# Patient Record
Sex: Female | Born: 1985 | Race: Black or African American | Hispanic: No | Marital: Single | Smoking: Never smoker
Health system: Southern US, Community
[De-identification: ages and names within clinical notes are randomized; demographics above are authoritative.]

## PROBLEM LIST (undated history)

## (undated) DIAGNOSIS — G35 Multiple sclerosis: Secondary | ICD-10-CM

## (undated) DIAGNOSIS — J45909 Unspecified asthma, uncomplicated: Secondary | ICD-10-CM

## (undated) DIAGNOSIS — E282 Polycystic ovarian syndrome: Principal | ICD-10-CM

## (undated) DIAGNOSIS — H539 Unspecified visual disturbance: Secondary | ICD-10-CM

## (undated) DIAGNOSIS — E669 Obesity, unspecified: Secondary | ICD-10-CM

## (undated) DIAGNOSIS — N83209 Unspecified ovarian cyst, unspecified side: Secondary | ICD-10-CM

## (undated) DIAGNOSIS — N912 Amenorrhea, unspecified: Principal | ICD-10-CM

## (undated) HISTORY — DX: Unspecified ovarian cyst, unspecified side: N83.209

## (undated) HISTORY — DX: Amenorrhea, unspecified: N91.2

## (undated) HISTORY — DX: Multiple sclerosis: G35

## (undated) HISTORY — DX: Polycystic ovarian syndrome: E28.2

## (undated) HISTORY — DX: Obesity, unspecified: E66.9

## (undated) HISTORY — DX: Unspecified visual disturbance: H53.9

## (undated) HISTORY — PX: CYST EXCISION: SHX5701

## (undated) HISTORY — PX: OVARIAN CYST REMOVAL: SHX89

---
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
## 2006-07-26 ENCOUNTER — Emergency Department (HOSPITAL_COMMUNITY): Admission: EM | Admit: 2006-07-26 | Discharge: 2006-07-26 | Payer: Self-pay | Admitting: Emergency Medicine

## 2006-08-02 ENCOUNTER — Emergency Department (HOSPITAL_COMMUNITY): Admission: EM | Admit: 2006-08-02 | Discharge: 2006-08-02 | Payer: Self-pay | Admitting: Emergency Medicine

## 2006-10-30 ENCOUNTER — Emergency Department (HOSPITAL_COMMUNITY): Admission: EM | Admit: 2006-10-30 | Discharge: 2006-10-30 | Payer: Self-pay | Admitting: Emergency Medicine

## 2007-03-11 IMAGING — CT CT HEAD W/O CM
1 series · 16 of 28 positions shown, 20 images · IV contrast (agent unspecified)
Comparison: Previous study 08/23/02

CLINICAL DATA: Headache.
HEAD CT WITHOUT CONTRAST:
TECHNIQUE: Contiguous axial images were obtained from the base of the skull through the vertex according to standard protocol without contrast.

[Series 6834: — · axial · 0.46mm/px · z∈[-591,-466]mm · 16 of 28 slices shown, 20 images]
[im 2/28  brain]
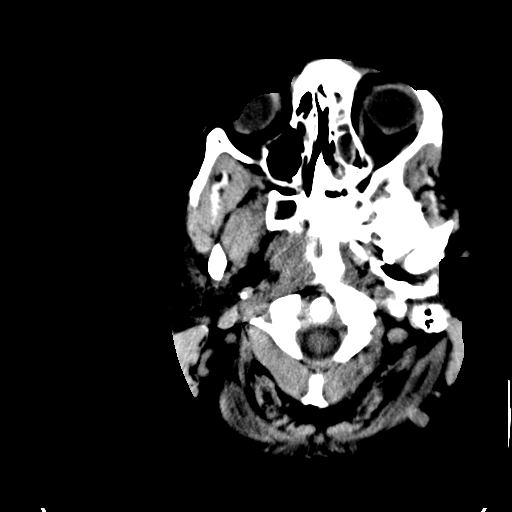
[im 2/28  bone]
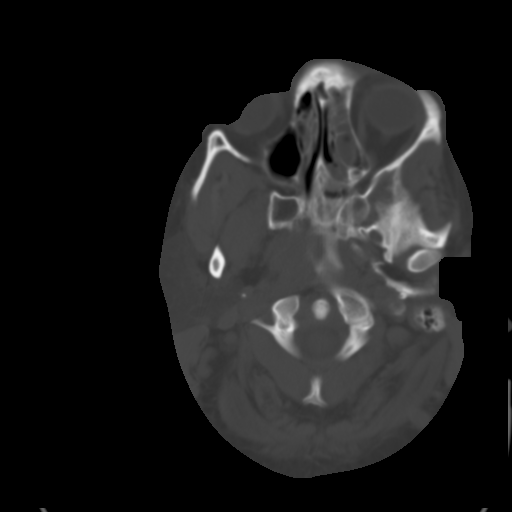
[im 4/28  brain]
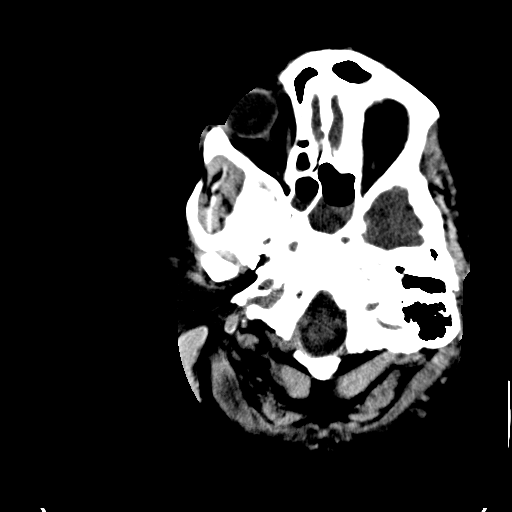
[im 6/28  brain]
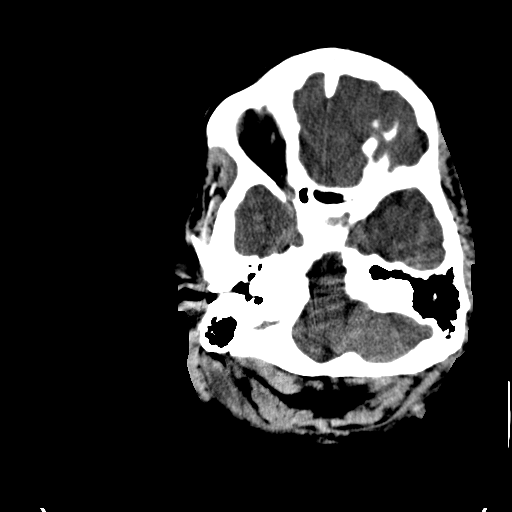
[im 7/28  brain]
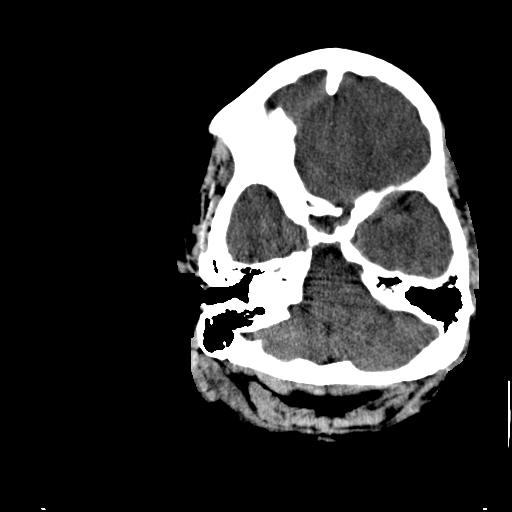
[im 9/28  brain]
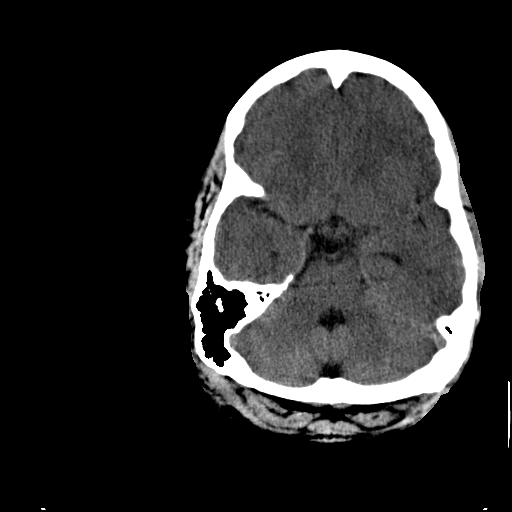
[im 9/28  bone]
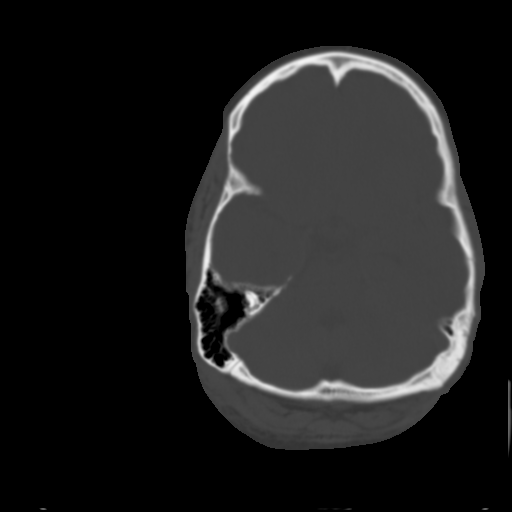
[im 10/28  brain]
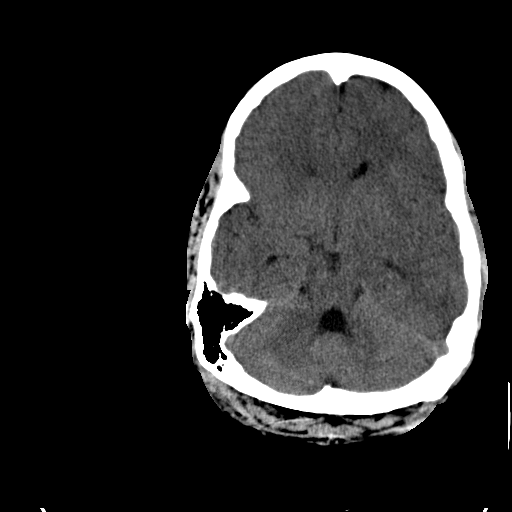
[im 12/28  brain]
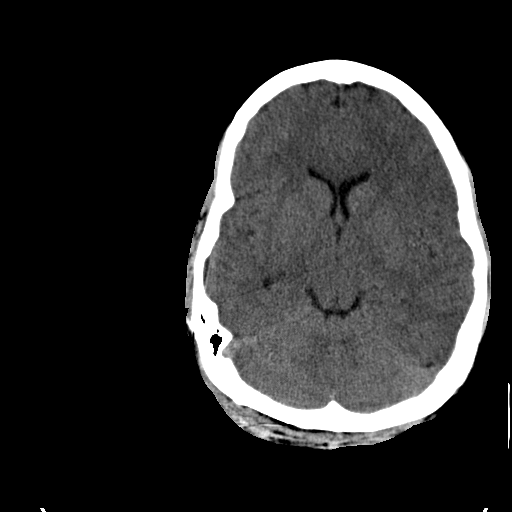
[im 14/28  brain]
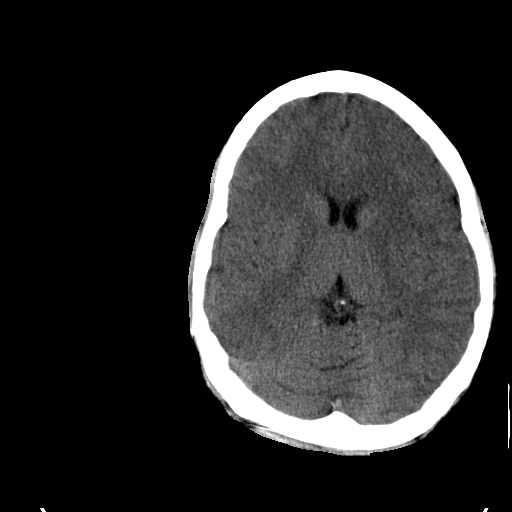
[im 15/28  brain]
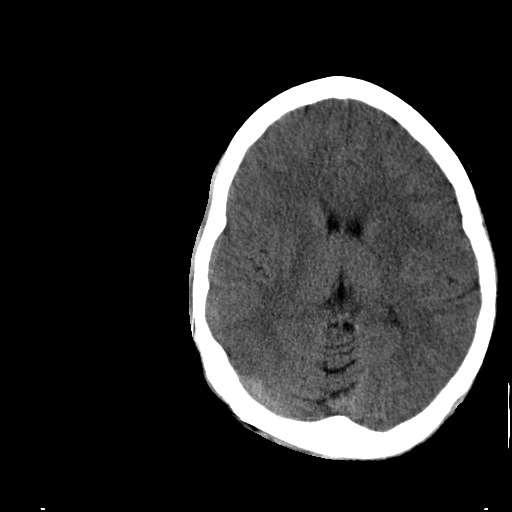
[im 15/28  bone]
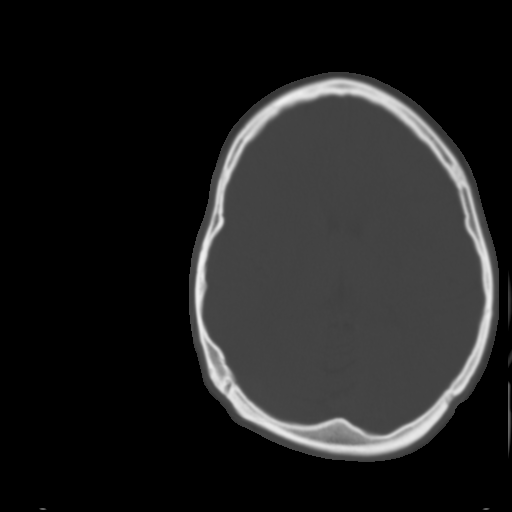
[im 17/28  brain]
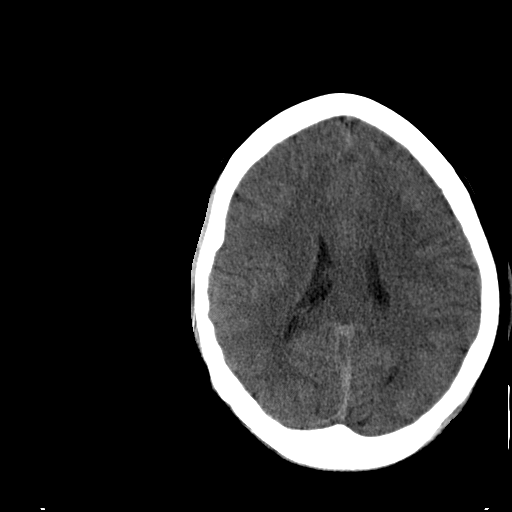
[im 19/28  brain]
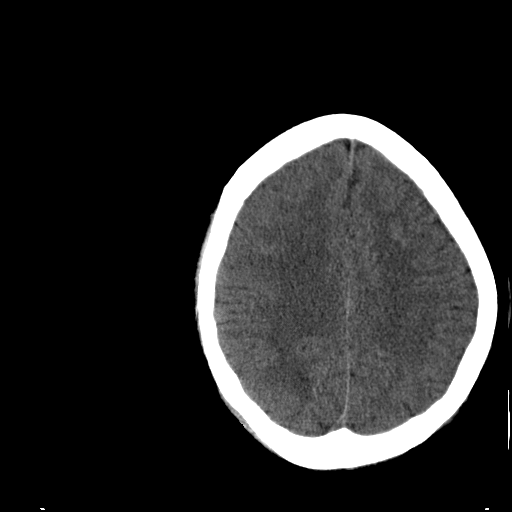
[im 20/28  brain]
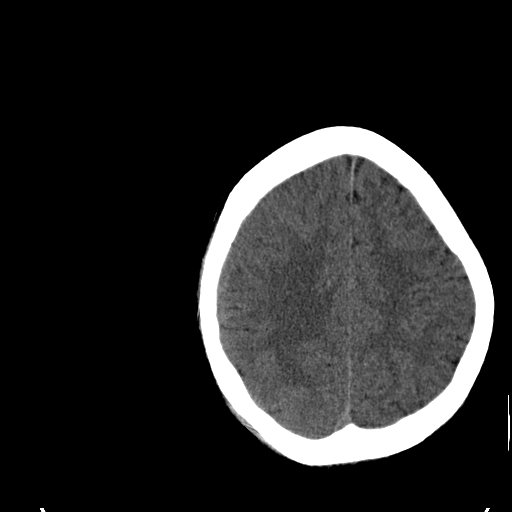
[im 22/28  brain]
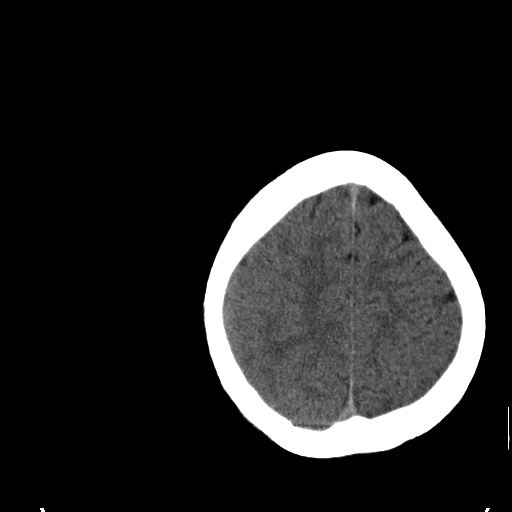
[im 22/28  bone]
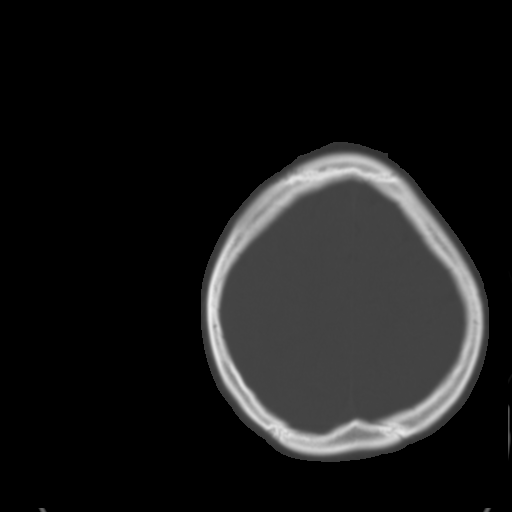
[im 23/28  brain]
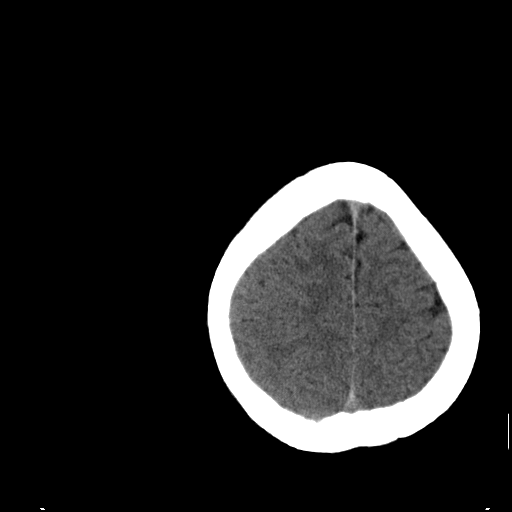
[im 25/28  brain]
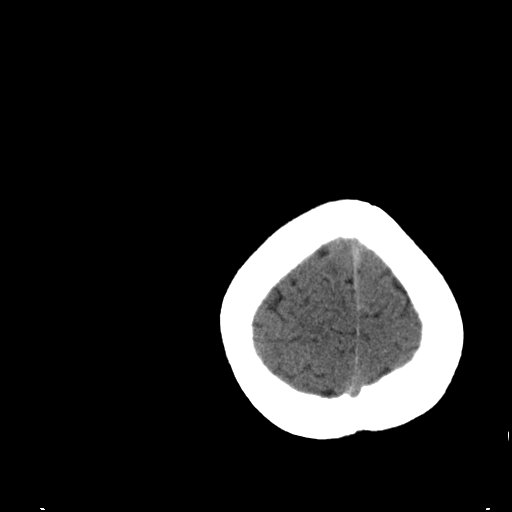
[im 27/28  brain]
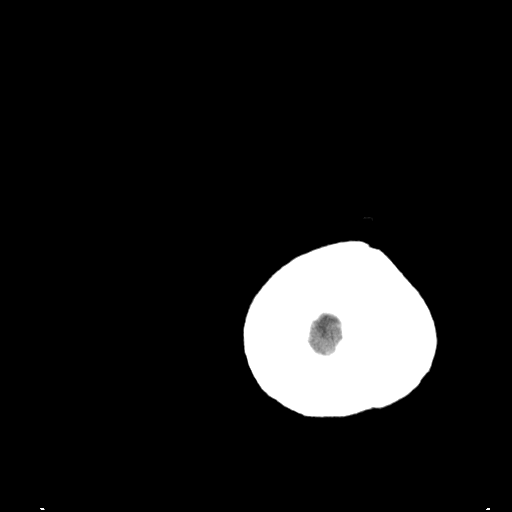

[16 of 28 positions shown; findings below may reference images not displayed]

FINDINGS: Air fluid levels are noted in the sphenoid sinus consistent with acute sinusitis.  No focal or acute intracranial abnormalities are seen.  The ventricles are normal in size and midline.  There are no areas of hemorrhage or brain edema.  No extra-axial fluid collections are present.  Bone windows show the calvarium and skull base to be intact.  Note is made of moderate nasopharyngeal soft tissue probably related to lymphoid hyperplasia.  
Note is made of moderate bilateral ethmoid fluid accumulation as well.
IMPRESSION: 1.  Acute sphenoid sinusitis.
2.  Moderate ethmoid fluid.
3.  No acute intracranial abnormality.
4.  Moderate lymphoid hyperplasia in the nasopharynx.

## 2013-04-20 DIAGNOSIS — I10 Essential (primary) hypertension: Secondary | ICD-10-CM | POA: Insufficient documentation

## 2013-04-27 ENCOUNTER — Encounter (HOSPITAL_COMMUNITY): Payer: Self-pay | Admitting: Emergency Medicine

## 2013-04-27 ENCOUNTER — Emergency Department (HOSPITAL_COMMUNITY)
Admission: EM | Admit: 2013-04-27 | Discharge: 2013-04-27 | Disposition: A | Discharge status: Home / Self Care | Payer: Medicaid Other | Attending: Emergency Medicine | Admitting: Emergency Medicine

## 2013-04-27 DIAGNOSIS — J45901 Unspecified asthma with (acute) exacerbation: Secondary | ICD-10-CM | POA: Insufficient documentation

## 2013-04-27 DIAGNOSIS — R0602 Shortness of breath: Secondary | ICD-10-CM | POA: Insufficient documentation

## 2013-04-27 DIAGNOSIS — J4521 Mild intermittent asthma with (acute) exacerbation: Secondary | ICD-10-CM

## 2013-04-27 DIAGNOSIS — E669 Obesity, unspecified: Secondary | ICD-10-CM | POA: Insufficient documentation

## 2013-04-27 DIAGNOSIS — Z79899 Other long term (current) drug therapy: Secondary | ICD-10-CM | POA: Insufficient documentation

## 2013-04-27 HISTORY — DX: Unspecified asthma, uncomplicated: J45.909

## 2013-04-27 MED ORDER — ALBUTEROL SULFATE (5 MG/ML) 0.5% IN NEBU
2.5000 mg | INHALATION_SOLUTION | Freq: Once | RESPIRATORY_TRACT | Status: AC
  Administered 2013-04-27: 2.5 mg via RESPIRATORY_TRACT
  Filled 2013-04-27: qty 0.5

## 2013-04-27 MED ORDER — IPRATROPIUM BROMIDE 0.02 % IN SOLN
0.5000 mg | Freq: Once | RESPIRATORY_TRACT | Status: AC
  Administered 2013-04-27: 0.5 mg via RESPIRATORY_TRACT
  Filled 2013-04-27: qty 2.5

## 2013-04-27 MED ORDER — ALBUTEROL SULFATE HFA 108 (90 BASE) MCG/ACT IN AERS: 2.0000 | INHALATION_SPRAY | RESPIRATORY_TRACT | Status: DC | PRN

## 2013-04-27 MED ORDER — ALBUTEROL SULFATE HFA 108 (90 BASE) MCG/ACT IN AERS
2.0000 | INHALATION_SPRAY | RESPIRATORY_TRACT | Status: DC | PRN
  Administered 2013-04-27: 2 via RESPIRATORY_TRACT
  Filled 2013-04-27: qty 6.7

## 2013-04-27 NOTE — ED Notes (Signed)
RT paged to administer neb treatment

## 2013-04-27 NOTE — ED Provider Notes (Signed)
History    This chart was scribed for [employer]. Rubin Payor, MD by Sofie Rower, ED Scribe. The patient was seen in room [facility] and the patient's care was started at 10:37PM.    CSN: [id number]  Arrival date & time 04/27/13  2114   First MD Initiated Contact with Patient 04/27/13 2237      Chief Complaint  Patient presents with  . Asthma    (Consider location/radiation/quality/duration/timing/severity/associated sxs/prior treatment) The history is provided by the patient. No language interpreter was used.    Brandi Hurley is a 27 y.o. female , with a hx of asthma, who presents to the Emergency Department complaining of sudden, progressively worsening, asthma, onset today (04/27/13). Associated symptoms include wheezing and shortness of breath. The pt reports she began wheezing this evening, however, she has recently ran out of her previously prescribed albuterol inhaler, prompting her concern and desire to seek medical evaluation at [facility] this evening (04/27/13).  The pt denies cough, nausea, vomiting, diarrhea and chest pain.  The pt does not smoke or drink alcohol.   Pt does not have a PCP.    Past Medical History  Diagnosis Date  . Asthma     History reviewed. No pertinent past surgical history.  No family history on file.  History  Substance Use Topics  . Smoking status: Never Smoker   . Smokeless tobacco: Not on file  . Alcohol Use: No    OB History   Grav Para Term Preterm Abortions TAB SAB Ect Mult Living                  Review of Systems  Respiratory: Positive for shortness of breath and wheezing. Negative for cough.   Cardiovascular: Negative for chest pain.  Gastrointestinal: Negative for nausea, vomiting and diarrhea.  All other systems reviewed and are negative.    Allergies  Review of patient's allergies indicates no known allergies.  Home Medications   Current Outpatient Rx  Name  Route  Sig  Dispense  Refill  . albuterol (PROVENTIL  HFA;VENTOLIN HFA) 108 (90 BASE) MCG/ACT inhaler   Inhalation   Inhale 2 puffs into the lungs every 4 (four) hours as needed for wheezing.         Marland Kitchen albuterol (PROVENTIL HFA;VENTOLIN HFA) 108 (90 BASE) MCG/ACT inhaler   Inhalation   Inhale 2 puffs into the lungs every 4 (four) hours as needed for wheezing or shortness of breath.   1 Inhaler   0     BP 113/75  Pulse 103  Temp(Src) 98.3 F (36.8 C) (Oral)  Resp 20  Ht 6' (1.829 m)  Wt 310 lb (140.615 kg)  BMI 42.03 kg/m2  SpO2 98%  LMP 03/27/2013  Physical Exam  Nursing note and vitals reviewed. Constitutional: She is oriented to person, place, and time. She appears well-developed and well-nourished. No distress.  Pt obese. Pt hirsute.   HENT:  Head: Normocephalic and atraumatic.  Eyes: EOM are normal. Pupils are equal, round, and reactive to light.  Neck: Neck supple. No tracheal deviation present.  Dark Spotting at the back of the neck.   Cardiovascular: Normal rate.   Pulmonary/Chest: Effort normal and breath sounds normal. No respiratory distress. She has no wheezes.  Abdominal: Soft. She exhibits no distension.  Musculoskeletal: Normal range of motion. She exhibits no edema.  Neurological: She is alert and oriented to person, place, and time. No sensory deficit.  Skin: Skin is warm and dry.  Psychiatric: She has a  normal mood and affect. Her behavior is normal.    ED Course  Procedures (including critical care time)  DIAGNOSTIC STUDIES:    COORDINATION OF CARE:   10:42 PM- Treatment plan discussed with patient. Pt agrees with treatment.     Labs Reviewed - No data to display No results found.   1. Asthma, mild intermittent, with acute exacerbation       MDM  Patient with asthma. Lungs now clear. She is out of her inhaler.      her inhaler was refilled and she'll be discharged home. She is in no respiratory distress. No Fevers. No focal findings on exam I personally performed the services  described in this documentation, which was scribed in my presence. The recorded information has been reviewed and is accurate.     Juliet Rude. Rubin Payor, MD 04/27/13 [phone]

## 2013-04-27 NOTE — Discharge Instructions (Signed)
Asthma, Adult Asthma is a condition that affects your lungs. It is characterized by swelling and narrowing of your airways as well as increased mucus production. The narrowing comes from swelling and muscle spasms inside the airways. When this happens, breathing can be difficult and you can have coughing, wheezing, and shortness of breath. Knowing more about asthma can help you manage it better. Asthma cannot be cured, but medicines and lifestyle changes can help control it. Asthma can be a minor problem for some people but if it is not controlled it can lead to a life-threatening asthma attack. Asthma can change over time. It is important to work with your caregiver to manage your asthma symptoms. CAUSES The exact cause of asthma is unknown. Asthma is believed to be caused by inherited (genetic) and environmental exposures. Swelling and redness (inflammation) of the airways occurs in asthma. This can be triggered by allergies, viral lung infections, or irritants in the air. Allergic reactions can cause you to wheeze immediately or several hours after an exposure. Asthma triggers are different for each person. It is important to pay attention and know what triggers your asthma.  Common triggers for asthma attacks include:  Animal dander from the skin, hair, or feathers of animals.  Dust mites contained in house dust.  Cockroaches.  Pollen from trees or grass.  Mold.  Cigarette or tobacco smoke. Smoking cannot be allowed in homes of people with asthma. People with asthma should not smoke and should not be around smokers.  Air pollutants such as dust, household cleaners, hair sprays, aerosol sprays, paint fumes, strong chemicals, or strong odors.  Cold air or weather changes. Cold air may cause inflammation. Winds increase molds and pollens in the air. There is not one best climate for people with asthma.  Strong emotions such as crying or laughing hard.  Stress.  Certain medicines such as  aspirin or beta-blockers.  Sulfites in such foods and drinks as dried fruits and wine.  Infections or inflammatory conditions such as the flu, a cold, or an inflammation of the nasal membranes (rhinitis).  Gastroesophageal reflux disease (GERD). GERD is a condition where stomach acid backs up into your throat (esophagus).  Exercise or strenous activity. Proper pre-exercise medicines allow most people to participate in sports. SYMPTOMS  Feeling short of breath.  Chest tightness or pain.  Difficulty sleeping due to coughing, wheezing, or feeling short of breath.  A whistling or wheezing sound with exhalation.  Coughing or wheezing that is worse when you:  Have a virus (such as a cold or the flu).  Are suffering from allergies.  Are exposed to certain fumes or chemicals.  Exercise. Signs that your asthma is probably getting worse include:   More frequent and bothersome asthma signs and symptoms.  Increasing difficulty breathing. This can be measured by a peak flow meter, which is a simple device used to check how well your lungs are working.  An increasingly frequent need to use a quick-relief inhaler. DIAGNOSIS  The diagnosis of asthma is made by review of your medical history, a physical exam, and possibly from other tests. Lung function studies may help with the diagnosis. TREATMENT  Asthma cannot be cured. However, for the majority of adults, asthma can be controlled with treatment. Besides avoidance of triggers of your asthma, medicines are often required. There are 2 classes of medicine used for asthma treatment: controller medicines (reduce inflammation and symptoms) andreliever or rescue medicines (relieve asthma symptoms during acute attacks). You may require daily   medicines to control your asthma. The most effective long-term controller medicines for asthma are inhaled corticosteroids (blocks inflammation). Other long-term control medicines include:  Leukotriene  receptor antagonists (blocks a pathway of inflammation).  Long-acting beta2-agonists (relaxes the muscles of the airways for at least 12 hours) with an inhaled corticosteroid.  Cromolyn sodium or nedocromil (alters certain inflammatory cells' ability to release chemicals that cause inflammation).  Immunomodulators (alters the immune system to prevent asthma symptoms).  Theophylline (relaxes muscles in the airways). You may also require a short-acting beta2-agonist to relieve asthma symptoms during an acute attack. You should understand what to do during an acute attack. Inhaled medicines are effective when used properly. Read the instructions on how to use your medicines correctly and speak to your caregiver if you have questions. Follow up with your caregiver on a regular basis to make sure your asthma is well-controlled. If your asthma is not well-controlled, if you have been hospitalized for asthma, or if multiple medicines or medium to high doses of inhaled corticosteroids are needed to control your asthma, request a referral to an asthma specialist. HOME CARE INSTRUCTIONS   Take medicines as directed by your caregiver.  Control your home environment in the following ways to help prevent asthma attacks:  Change your heating and air conditioning filter at least once a month.  Place a filter or cheesecloth over your heating and air conditioning vents.  Limit the use of fireplaces and wood stoves.  Do not smoke. Do not stay in places where others are smoking.  Get rid of pests (such as roaches and mice) and their droppings.  If you see mold on a plant, throw it away.  Clean your floors and dust every week. Use unscented cleaning products. Use a vacuum cleaner with a HEPA filter if possible. If vacuuming or cleaning triggers your asthma, try to find someone else to do these chores.  Floors in your house should be wood, tile, or vinyl. Carpet can trap dander and dust.  Use  allergy-proof pillows, mattress covers, and box spring covers.  Wash bedsheets and blankets every week in hot water and dry in a dryer.  Use a blanket that is made of polyester or cotton with a tight nap.  Do not use a dust ruffle on your bed.  Clean bathrooms and kitchens with bleach and repaint with mold-resistant paint.  Wash hands frequently.  Talk to your caregiver about an action plan for managing asthma attacks. This includes the use of a peak flow meter which measures the severity of the attack and medicines that can help stop the attack. An action plan can help minimize or stop the attack without having to seek medical care.  Remain calm during an asthma attack.  Always have a plan prepared for seeking medical attention. This should include contacting your caregiver and in the case of a severe attack, calling your local emergency services (911 in [place] SEEK MEDICAL CARE IF:   You have wheezing, shortness of breath, or a cough even if taking medicine to prevent attacks.  You have thickening of sputum.  Your sputum changes from clear or white to yellow, green, gray, or bloody.  You have any problems that may be related to the medicines you are taking (such as a rash, itching, swelling, or trouble breathing).  You are using a reliever medicine more than 2 3 times per week.  Your peak flow is still at 50 79% of personal best after following your action plan for 1   hour. SEEK IMMEDIATE MEDICAL CARE IF:   You are short of breath even at rest.  You get short of breath when doing very little physical activity.  You have difficulty eating, drinking, or talking due to asthma symptoms.  You have chest pain or you feel that your heart is beating fast.  You have a bluish color to your lips or fingernails.  You are lightheaded, dizzy, or faint.  You have a fever or persistent symptoms for more than 2 3 days.  You have a fever and symptoms suddenly get worse.  You seem to be  getting worse and are unresponsive to treatment during an asthma attack.  Your peak flow is less than 50% of personal best. MAKE SURE YOU:   Understand these instructions.  Will watch your condition.  Will get help right away if you are not doing well or get worse. Document Released: 10/31/2005 Document Revised: 10/17/2012 Document Reviewed: 06/18/2008 [employer] Patient Information 2014 [employer].  

## 2013-04-27 NOTE — ED Notes (Signed)
Pt receiving neb treatment at this time. Respiratory status stable at this time.

## 2013-04-27 NOTE — ED Notes (Signed)
Patient c/o wheezing; states ran out of her MDI last night.  Patient states started getting short of breath about an hour ago.

## 2013-04-27 NOTE — ED Notes (Signed)
Patient has auditory expiratory wheezing.

## 2013-05-11 ENCOUNTER — Encounter (HOSPITAL_COMMUNITY): Payer: Self-pay | Admitting: *Deleted

## 2013-05-11 ENCOUNTER — Emergency Department (HOSPITAL_COMMUNITY)
Admission: EM | Admit: 2013-05-11 | Discharge: 2013-05-11 | Disposition: A | Discharge status: Home / Self Care | Payer: Medicaid Other | Attending: Emergency Medicine | Admitting: Emergency Medicine

## 2013-05-11 DIAGNOSIS — R1909 Other intra-abdominal and pelvic swelling, mass and lump: Secondary | ICD-10-CM | POA: Insufficient documentation

## 2013-05-11 DIAGNOSIS — Z3202 Encounter for pregnancy test, result negative: Secondary | ICD-10-CM | POA: Insufficient documentation

## 2013-05-11 DIAGNOSIS — N898 Other specified noninflammatory disorders of vagina: Secondary | ICD-10-CM | POA: Insufficient documentation

## 2013-05-11 DIAGNOSIS — N760 Acute vaginitis: Secondary | ICD-10-CM

## 2013-05-11 DIAGNOSIS — N762 Acute vulvitis: Secondary | ICD-10-CM

## 2013-05-11 DIAGNOSIS — N9489 Other specified conditions associated with female genital organs and menstrual cycle: Secondary | ICD-10-CM | POA: Insufficient documentation

## 2013-05-11 DIAGNOSIS — R300 Dysuria: Secondary | ICD-10-CM | POA: Insufficient documentation

## 2013-05-11 DIAGNOSIS — Z79899 Other long term (current) drug therapy: Secondary | ICD-10-CM | POA: Insufficient documentation

## 2013-05-11 DIAGNOSIS — J45909 Unspecified asthma, uncomplicated: Secondary | ICD-10-CM | POA: Insufficient documentation

## 2013-05-11 LAB — URINALYSIS, ROUTINE W REFLEX MICROSCOPIC
Bilirubin Urine: NEGATIVE
Ketones, ur: NEGATIVE mg/dL
Leukocytes, UA: NEGATIVE
Nitrite: NEGATIVE
Protein, ur: NEGATIVE mg/dL
Urobilinogen, UA: 1 mg/dL (ref 0.0–1.0)
pH: 6 (ref 5.0–8.0)

## 2013-05-11 LAB — WET PREP, GENITAL

## 2013-05-11 LAB — POCT PREGNANCY, URINE: Preg Test, Ur: NEGATIVE

## 2013-05-11 MED ORDER — LIDOCAINE HCL (PF) 1 % IJ SOLN
INTRAMUSCULAR | Status: AC
  Administered 2013-05-11: 5 mL
  Filled 2013-05-11: qty 5

## 2013-05-11 MED ORDER — IBUPROFEN 600 MG PO TABS: 600.0000 mg | ORAL_TABLET | Freq: Four times a day (QID) | ORAL | Status: DC | PRN

## 2013-05-11 MED ORDER — METRONIDAZOLE 500 MG PO TABS
500.0000 mg | ORAL_TABLET | Freq: Once | ORAL | Status: AC
  Administered 2013-05-11: 500 mg via ORAL
  Filled 2013-05-11: qty 1

## 2013-05-11 MED ORDER — DOXYCYCLINE HYCLATE 100 MG PO CAPS: 100.0000 mg | ORAL_CAPSULE | Freq: Two times a day (BID) | ORAL | Status: DC

## 2013-05-11 MED ORDER — CEFTRIAXONE SODIUM 250 MG IJ SOLR
250.0000 mg | Freq: Once | INTRAMUSCULAR | Status: AC
  Administered 2013-05-11: 250 mg via INTRAMUSCULAR
  Filled 2013-05-11: qty 250

## 2013-05-11 MED ORDER — METRONIDAZOLE 500 MG PO TABS: 500.0000 mg | ORAL_TABLET | Freq: Two times a day (BID) | ORAL | Status: DC

## 2013-05-11 MED ORDER — DOXYCYCLINE HYCLATE 100 MG PO TABS
100.0000 mg | ORAL_TABLET | Freq: Once | ORAL | Status: AC
  Administered 2013-05-11: 100 mg via ORAL
  Filled 2013-05-11: qty 1

## 2013-05-11 MED ORDER — IBUPROFEN 800 MG PO TABS
800.0000 mg | ORAL_TABLET | Freq: Once | ORAL | Status: AC
  Administered 2013-05-11: 800 mg via ORAL
  Filled 2013-05-11: qty 1

## 2013-05-11 NOTE — ED Notes (Signed)
Pt presents with vaginal pain that increases with voiding and " wiping bottom". Pt denies fever, and vaginal discharge. Pt reports groin swelling in left femoral

## 2013-05-11 NOTE — ED Notes (Addendum)
Pt c/o vaginal pain and swelling. Pt states it hurts to wipe.x 2 days. Pt states she feels like she has a knot "down there." No other symptoms at all.

## 2013-05-12 LAB — RPR: RPR Ser Ql: NONREACTIVE

## 2013-05-14 NOTE — ED Provider Notes (Signed)
History    CSN: [id number] Arrival date & time 05/11/13  1941  First MD Initiated Contact with Patient 05/11/13 2028     Chief Complaint  Patient presents with  . Vaginal Pain  . Groin Swelling   (Consider location/radiation/quality/duration/timing/severity/associated sxs/prior Treatment) Patient is a 27 y.o. female presenting with vaginal pain. The history is provided by the patient.  Vaginal Pain This is a new problem. Episode onset: 2 days ago. The problem occurs constantly. The problem has been gradually worsening. Associated symptoms include urinary symptoms. Pertinent negatives include no abdominal pain, arthralgias, chest pain, chills, congestion, fever, headaches, joint swelling, myalgias, nausea, neck pain, numbness, rash, sore throat or weakness. Associated symptoms comments: She has swelling in her vagina and reports burning pain with urination.  She denies vaginal discharge and pelvic pain.  . The symptoms are aggravated by intercourse (She is sexually active.  SHe denies vaginal trauma.). She has tried nothing for the symptoms.   Past Medical History  Diagnosis Date  . Asthma    History reviewed. No pertinent past surgical history. History reviewed. No pertinent family history. History  Substance Use Topics  . Smoking status: Never Smoker   . Smokeless tobacco: Not on file  . Alcohol Use: No   OB History   Grav Para Term Preterm Abortions TAB SAB Ect Mult Living                 Review of Systems  Constitutional: Negative for fever and chills.  HENT: Negative for congestion, sore throat and neck pain.   Eyes: Negative.   Respiratory: Negative for chest tightness and shortness of breath.   Cardiovascular: Negative for chest pain.  Gastrointestinal: Negative for nausea and abdominal pain.  Genitourinary: Positive for dysuria, genital sores and vaginal pain. Negative for urgency, hematuria, vaginal bleeding, vaginal discharge, menstrual problem and pelvic pain.   Musculoskeletal: Negative for myalgias, joint swelling and arthralgias.  Skin: Negative.  Negative for rash and wound.  Neurological: Negative for dizziness, weakness, light-headedness, numbness and headaches.  Psychiatric/Behavioral: Negative.     Allergies  Review of patient's allergies indicates no known allergies.  Home Medications   Current Outpatient Rx  Name  Route  Sig  Dispense  Refill  . albuterol (PROVENTIL HFA;VENTOLIN HFA) 108 (90 BASE) MCG/ACT inhaler   Inhalation   Inhale 2 puffs into the lungs every 4 (four) hours as needed for wheezing or shortness of breath.   1 Inhaler   0   . doxycycline (VIBRAMYCIN) 100 MG capsule   Oral   Take 1 capsule (100 mg total) by mouth 2 (two) times daily.   20 capsule   0   . ibuprofen (ADVIL,MOTRIN) 600 MG tablet   Oral   Take 1 tablet (600 mg total) by mouth every 6 (six) hours as needed for pain.   30 tablet   0   . metroNIDAZOLE (FLAGYL) 500 MG tablet   Oral   Take 1 tablet (500 mg total) by mouth 2 (two) times daily.   14 tablet   0    BP 113/64  Pulse 101  Temp(Src) 98.6 F (37 C) (Oral)  Resp 20  Ht 6' (1.829 m)  Wt 330 lb (149.687 kg)  BMI 44.75 kg/m2  SpO2 98%  LMP 03/27/2013 Physical Exam  Nursing note and vitals reviewed. Constitutional: She appears well-developed and well-nourished.  HENT:  Head: Normocephalic and atraumatic.  Eyes: Conjunctivae are normal.  Neck: Normal range of motion.  Cardiovascular: Normal rate,  regular rhythm, normal heart sounds and intact distal pulses.   Pulmonary/Chest: Effort normal and breath sounds normal. She has no wheezes.  Abdominal: Soft. Bowel sounds are normal. She exhibits no distension. There is no tenderness. There is no guarding.  Genitourinary: Uterus normal. There is tenderness on the right labia. Cervix exhibits no motion tenderness and no discharge. Right adnexum displays no mass, no tenderness and no fullness. Left adnexum displays no mass, no  tenderness and no fullness. No erythema or tenderness around the vagina. Vaginal discharge found.  Right labia majora is edematous diffusely,  Tender to palpation,  No rash, lesion, no palpable induration or fluctuance suggestive of abscess.  Musculoskeletal: Normal range of motion.  Neurological: She is alert.  Skin: Skin is warm and dry.  Psychiatric: She has a normal mood and affect.    ED Course  Procedures (including critical care time) Labs Reviewed  WET PREP, GENITAL - Abnormal; Notable for the following:    Clue Cells Wet Prep HPF POC MANY (*)    WBC, Wet Prep HPF POC MODERATE (*)    All other components within normal limits  URINALYSIS, ROUTINE W REFLEX MICROSCOPIC - Abnormal; Notable for the following:    APPearance HAZY (*)    All other components within normal limits  GC/CHLAMYDIA PROBE AMP  RPR  POCT PREGNANCY, URINE   No results found. 1. Bacterial vaginosis   2. Cellulitis of labia majora     MDM  Patients labs and/or radiological studies were viewed and considered during the medical decision making and disposition process.  Discussed results with patient ,  Std cultures pending.  She did opt for tx for stds today,  Given rocephin 250 IM,  Started on doxycycline for labial edema,  Possible early bartholins abscess vs cellulitis.  SHe was encouraged warm epsom salt bath soaks.  Flagyl for bv,  Ibuprofen prn pain.  Referral to gyn for f/u care.  Discussed with Dr. Effie Shy prior to dc home.  Burgess Amor, PA-C 05/14/13 [phone]

## 2013-05-15 NOTE — ED Provider Notes (Signed)
Medical screening examination/treatment/procedure(s) were performed by non-physician practitioner and as supervising physician I was immediately available for consultation/collaboration.  Flint Melter, MD 05/15/13 1106

## 2013-05-23 ENCOUNTER — Encounter: Payer: Self-pay | Admitting: Obstetrics & Gynecology

## 2013-09-05 ENCOUNTER — Encounter: Payer: Self-pay | Admitting: Adult Health

## 2013-09-05 ENCOUNTER — Ambulatory Visit (INDEPENDENT_AMBULATORY_CARE_PROVIDER_SITE_OTHER): Payer: Medicaid Other | Admitting: Adult Health

## 2013-09-05 VITALS — BP 120/80 | Ht 72.0 in | Wt 338.0 lb

## 2013-09-05 DIAGNOSIS — Z3200 Encounter for pregnancy test, result unknown: Secondary | ICD-10-CM

## 2013-09-05 DIAGNOSIS — Z3202 Encounter for pregnancy test, result negative: Secondary | ICD-10-CM

## 2013-09-05 LAB — POCT URINE PREGNANCY: Preg Test, Ur: NEGATIVE

## 2013-09-05 NOTE — Progress Notes (Signed)
Patient ID: Brandi Hurley, female   DOB: [birth year], 27 y.o.   MRN: [id number] Pt here today for UPT. Pt states that she has had 4 missed AB's. Pt states that she has not had a period since August of this year. Pt states that she had a cyst and ovary removed about 2 years ago.

## 2013-09-19 ENCOUNTER — Ambulatory Visit: Payer: Medicaid Other | Admitting: Obstetrics and Gynecology

## 2013-09-19 ENCOUNTER — Other Ambulatory Visit: Payer: Self-pay

## 2013-10-19 ENCOUNTER — Emergency Department (HOSPITAL_COMMUNITY)
Admission: EM | Admit: 2013-10-19 | Discharge: 2013-10-19 | Disposition: A | Discharge status: Home / Self Care | Payer: Medicaid Other | Attending: Emergency Medicine | Admitting: Emergency Medicine

## 2013-10-19 ENCOUNTER — Encounter (HOSPITAL_COMMUNITY): Payer: Self-pay | Admitting: Emergency Medicine

## 2013-10-19 DIAGNOSIS — J45909 Unspecified asthma, uncomplicated: Secondary | ICD-10-CM | POA: Insufficient documentation

## 2013-10-19 DIAGNOSIS — L0291 Cutaneous abscess, unspecified: Secondary | ICD-10-CM

## 2013-10-19 DIAGNOSIS — Z79899 Other long term (current) drug therapy: Secondary | ICD-10-CM | POA: Insufficient documentation

## 2013-10-19 DIAGNOSIS — L02219 Cutaneous abscess of trunk, unspecified: Secondary | ICD-10-CM | POA: Insufficient documentation

## 2013-10-19 MED ORDER — SULFAMETHOXAZOLE-TRIMETHOPRIM 800-160 MG PO TABS: 1.0000 | ORAL_TABLET | Freq: Two times a day (BID) | ORAL | Status: AC

## 2013-10-19 MED ORDER — LIDOCAINE HCL (PF) 2 % IJ SOLN
INTRAMUSCULAR | Status: AC
  Administered 2013-10-19: 19:00:00
  Filled 2013-10-19: qty 10

## 2013-10-19 MED ORDER — LIDOCAINE HCL (PF) 1 % IJ SOLN
INTRAMUSCULAR | Status: AC
  Filled 2013-10-19: qty 5

## 2013-10-19 MED ORDER — SULFAMETHOXAZOLE-TMP DS 800-160 MG PO TABS
2.0000 | ORAL_TABLET | Freq: Once | ORAL | Status: AC
  Administered 2013-10-19: 2 via ORAL
  Filled 2013-10-19: qty 2

## 2013-10-19 NOTE — ED Provider Notes (Signed)
CSN: [id number]     Arrival date & time 10/19/13  1752 History   First MD Initiated Contact with Patient 10/19/13 1757     Chief Complaint  Patient presents with  . Abscess   (Consider location/radiation/quality/duration/timing/severity/associated sxs/prior Treatment) Patient is a 27 y.o. female presenting with abscess. The history is provided by the patient and a parent.  Abscess Location:  Torso Torso abscess location:  Abd RUQ Size:  1 cm Abscess quality: fluctuance, induration, painful and redness   Abscess quality: not draining   Red streaking: no   Duration:  14 hours Progression:  Worsening Pain details:    Quality:  Throbbing   Severity:  Moderate   Timing:  Constant   Progression:  Worsening Chronicity:  New Context: not diabetes, not immunosuppression, not injected drug use, not insect bite/sting and not skin injury   Relieved by:  Nothing Worsened by:  Draining/squeezing Ineffective treatments:  None tried Associated symptoms: no fever, no headaches and no nausea   Risk factors: no prior abscess     Past Medical History  Diagnosis Date  . Asthma    Past Surgical History  Procedure Laterality Date  . Cyst excision     History reviewed. No pertinent family history. History  Substance Use Topics  . Smoking status: Never Smoker   . Smokeless tobacco: Never Used  . Alcohol Use: No   OB History   Grav Para Term Preterm Abortions TAB SAB Ect Mult Living   4    4  4         Review of Systems  Constitutional: Negative for fever and chills.  HENT: Negative for congestion and sore throat.   Eyes: Negative.   Respiratory: Negative for chest tightness and shortness of breath.   Cardiovascular: Negative for chest pain.  Gastrointestinal: Negative for nausea and abdominal pain.  Genitourinary: Negative.   Musculoskeletal: Negative for arthralgias, joint swelling and neck pain.  Skin: Positive for color change and wound. Negative for rash.  Neurological:  Negative for dizziness, weakness, light-headedness, numbness and headaches.  Psychiatric/Behavioral: Negative.     Allergies  Review of patient's allergies indicates no known allergies.  Home Medications   Current Outpatient Rx  Name  Route  Sig  Dispense  Refill  . albuterol (PROVENTIL HFA;VENTOLIN HFA) 108 (90 BASE) MCG/ACT inhaler   Inhalation   Inhale 2 puffs into the lungs every 4 (four) hours as needed for wheezing or shortness of breath.   1 Inhaler   0   . sulfamethoxazole-trimethoprim (BACTRIM DS,SEPTRA DS) 800-160 MG per tablet   Oral   Take 1 tablet by mouth 2 (two) times daily.   20 tablet   0    BP 110/50  Pulse 76  Temp(Src) 97.9 F (36.6 C) (Oral)  Resp 20  Ht 6' (1.829 m)  Wt 340 lb (154.223 kg)  BMI 46.10 kg/m2  SpO2 100%  LMP 10/18/2013 Physical Exam  Constitutional: She is oriented to person, place, and time. She appears well-developed and well-nourished.  HENT:  Head: Normocephalic.  Cardiovascular: Normal rate.   Pulmonary/Chest: Effort normal.  Neurological: She is alert and oriented to person, place, and time. No sensory deficit.  Skin:  1 cm indurated abscess with central pustule, nondraining.  Tender,  No red streaking.    ED Course  Procedures (including critical care time)  INCISION AND DRAINAGE Performed by: Burgess Amor Consent: Verbal consent obtained. Risks and benefits: risks, benefits and alternatives were discussed Type: abscess  Body area:  abdomen  Anesthesia: local infiltration  Incision was made with a scalpel.  Local anesthetic: lidocaine 2% without epinephrine  Anesthetic total: 2 ml  Complexity: complex Blunt dissection to break up loculations  Drainage: purulent  Drainage amount: small purulence  Packing material: no packing required. Patient tolerance: Patient tolerated the procedure well with no immediate complications.    Labs Review Labs Reviewed - No data to display Imaging Review No results  found.  EKG Interpretation   None       MDM   1. Abscess    Pt prescribed bactrim, encouraged warm soaks.  Prn recheck here for any worsened pain,  Swelling, drainage, fever, etc.    Burgess Amor, PA-C 10/19/13 1847

## 2013-10-19 NOTE — ED Notes (Signed)
Pt c/o boil to her RUQ of abd, onset today.

## 2013-10-22 NOTE — ED Provider Notes (Signed)
Medical screening examination/treatment/procedure(s) were performed by non-physician practitioner and as supervising physician I was immediately available for consultation/collaboration.    Francisca Langenderfer D Juwuan Sedita, MD 10/22/13 0122 

## 2013-12-10 ENCOUNTER — Encounter: Payer: Self-pay | Admitting: Adult Health

## 2013-12-10 ENCOUNTER — Ambulatory Visit (INDEPENDENT_AMBULATORY_CARE_PROVIDER_SITE_OTHER): Payer: Medicaid Other | Admitting: Adult Health

## 2013-12-10 VITALS — BP 120/82 | Ht 72.0 in | Wt 342.0 lb

## 2013-12-10 DIAGNOSIS — N912 Amenorrhea, unspecified: Secondary | ICD-10-CM

## 2013-12-10 DIAGNOSIS — N926 Irregular menstruation, unspecified: Secondary | ICD-10-CM

## 2013-12-10 DIAGNOSIS — Z3200 Encounter for pregnancy test, result unknown: Secondary | ICD-10-CM

## 2013-12-10 DIAGNOSIS — Z3202 Encounter for pregnancy test, result negative: Secondary | ICD-10-CM

## 2013-12-10 HISTORY — DX: Amenorrhea, unspecified: N91.2

## 2013-12-10 LAB — POCT URINE PREGNANCY: Preg Test, Ur: NEGATIVE

## 2013-12-10 NOTE — Patient Instructions (Addendum)
Secondary Amenorrhea  Secondary amenorrhea is the stopping of menstrual flow for 3 6 months in a female who has previously had periods. There are many possible causes. Most of these causes are not serious. Usually, treating the underlying problem causing the loss of menses will return your periods to normal. CAUSES  Some common and uncommon causes of not menstruating include:  Malnutrition.  Low blood sugar (hypoglycemia).  Polycystic ovary disease.  Stress or fear.  Breastfeeding.  Hormone imbalance.  Ovarian failure.  Medicines.  Extreme obesity.  Cystic fibrosis.  Low body weight or drastic weight reduction from any cause.  Early menopause.  Removal of ovaries or uterus.  Contraceptives.  Illness.  Long-term (chronic) illnesses.  Cushing syndrome.  Thyroid problems.  Birth control pills, patches, or vaginal rings for birth control. RISK FACTORS You may be at greater risk of secondary amenorrhea if:  You have a family history of this condition.  You have an eating disorder.  You do athletic training. DIAGNOSIS  A diagnosis is made by your health care provider taking a medical history and doing a physical exam. This will include a pelvic exam to check for problems with your reproductive organs. Pregnancy must be ruled out. Often, numerous blood tests are done to measure different hormones in the body. Urine testing may be done. Specialized exams (ultrasound, CT scan, MRI, or hysteroscopy) may have to be done as well as measuring the body mass index (BMI). TREATMENT  Treatment depends on the cause of the amenorrhea. If an eating disorder is present, this can be treated with an adequate diet and therapy. Chronic illnesses may improve with treatment of the illness. Amenorrhea may be corrected with medicines, lifestyle changes, or surgery. If the amenorrhea cannot be corrected, it is sometimes possible to create a false menstruation with medicines. HOME CARE  INSTRUCTIONS  Maintain a healthy diet.  Manage weight problems.  Exercise regularly but not excessively.  Get adequate sleep.  Manage stress.  Be aware of changes in your menstrual cycle. Keep a record of when your periods occur. Note the date your period starts, how long it lasts, and any problems. SEEK MEDICAL CARE IF: Your symptoms do not get better with treatment. Document Released: 12/12/2006 Document Revised: 07/03/2013 Document Reviewed: 04/18/2013 [facility] Patient Information 2014 [place], [place]. Follow up  For [place] next week

## 2013-12-10 NOTE — Progress Notes (Signed)
Subjective:     Patient ID: Brandi Hurley, female   DOB: Jul 19, 1986, 28 y.o.   MRN: [id number]  HPI Brandi Hurley is a 28 year old black female, in complaining of no period for 2 months, is having sex with condoms.No other complaints.  Review of Systems See HPI Reviewed past medical,surgical, social and family history. Reviewed medications and allergies.     Objective:   Physical Exam BP 120/82  Ht 6' (1.829 m)  Wt 342 lb (155.13 kg)  BMI 46.37 kg/m2  LMP 11/27/2014UPT negative   Skin warm and dry.Pelvic: external genitalia is normal in appearance, vagina: scant discharge without odor, cervix:smooth, uterus: normal size, shape and contour, non tender, no masses felt, adnexa: no masses or tenderness noted. Assessment:     Amenorrhea    Plan:      Check CBC CMP and TSH   Return in 1 week for [place] Review handout on amenorrhea

## 2013-12-11 ENCOUNTER — Telehealth: Payer: Self-pay | Admitting: Adult Health

## 2013-12-11 LAB — COMPREHENSIVE METABOLIC PANEL
ALT: 23 U/L (ref 0–35)
AST: 13 U/L (ref 0–37)
Albumin: 3.9 g/dL (ref 3.5–5.2)
Alkaline Phosphatase: 92 U/L (ref 39–117)
BILIRUBIN TOTAL: 0.2 mg/dL — AB (ref 0.3–1.2)
BUN: 17 mg/dL (ref 6–23)
CALCIUM: 9.5 mg/dL (ref 8.4–10.5)
CO2: 28 meq/L (ref 19–32)
CREATININE: 0.79 mg/dL (ref 0.50–1.10)
Chloride: 101 mEq/L (ref 96–112)
GLUCOSE: 84 mg/dL (ref 70–99)
Potassium: 4.5 mEq/L (ref 3.5–5.3)
SODIUM: 137 meq/L (ref 135–145)
TOTAL PROTEIN: 7.6 g/dL (ref 6.0–8.3)

## 2013-12-11 LAB — CBC
HCT: 37.7 % (ref 36.0–46.0)
HEMOGLOBIN: 12.4 g/dL (ref 12.0–15.0)
MCH: 23.8 pg — AB (ref 26.0–34.0)
MCHC: 32.9 g/dL (ref 30.0–36.0)
MCV: 72.2 fL — AB (ref 78.0–100.0)
Platelets: 362 10*3/uL (ref 150–400)
RBC: 5.22 MIL/uL — AB (ref 3.87–5.11)
RDW: 16.1 % — ABNORMAL HIGH (ref 11.5–15.5)
WBC: 7.4 10*3/uL (ref 4.0–10.5)

## 2013-12-11 LAB — TSH: TSH: 1.712 u[IU]/mL (ref 0.350–4.500)

## 2013-12-11 NOTE — Telephone Encounter (Signed)
Left message labs back will discuss at f/u

## 2013-12-17 ENCOUNTER — Encounter: Payer: Self-pay | Admitting: Adult Health

## 2013-12-17 ENCOUNTER — Ambulatory Visit (INDEPENDENT_AMBULATORY_CARE_PROVIDER_SITE_OTHER): Payer: Medicaid Other

## 2013-12-17 ENCOUNTER — Ambulatory Visit (INDEPENDENT_AMBULATORY_CARE_PROVIDER_SITE_OTHER): Payer: Medicaid Other | Admitting: Adult Health

## 2013-12-17 VITALS — BP 112/70 | Ht 72.0 in | Wt 335.0 lb

## 2013-12-17 DIAGNOSIS — E282 Polycystic ovarian syndrome: Secondary | ICD-10-CM

## 2013-12-17 DIAGNOSIS — E669 Obesity, unspecified: Secondary | ICD-10-CM | POA: Insufficient documentation

## 2013-12-17 DIAGNOSIS — Z3202 Encounter for pregnancy test, result negative: Secondary | ICD-10-CM

## 2013-12-17 DIAGNOSIS — N912 Amenorrhea, unspecified: Secondary | ICD-10-CM

## 2013-12-17 DIAGNOSIS — N926 Irregular menstruation, unspecified: Secondary | ICD-10-CM

## 2013-12-17 HISTORY — DX: Polycystic ovarian syndrome: E28.2

## 2013-12-17 LAB — POCT URINE PREGNANCY: PREG TEST UR: NEGATIVE

## 2013-12-17 MED ORDER — NORETHIN ACE-ETH ESTRAD-FE 1-20 MG-MCG(24) PO CHEW: 1.0000 | CHEWABLE_TABLET | Freq: Every day | ORAL | Status: DC

## 2013-12-17 NOTE — Patient Instructions (Addendum)
Calorie Counting Diet A calorie counting diet requires you to eat the number of calories that are right for you in a day. Calories are the measurement of how much energy you get from the food you eat. Eating the right amount of calories is important for staying at a healthy weight. If you eat too many calories, your body will store them as fat and you may gain weight. If you eat too few calories, you may lose weight. Counting the number of calories you eat during a day will help you know if you are eating the right amount. A Registered Dietitian can determine how many calories you need in a day. The amount of calories needed varies from person to person. If your goal is to lose weight, you will need to eat fewer calories. Losing weight can benefit you if you are overweight or have health problems such as heart disease, high blood pressure, or diabetes. If your goal is to gain weight, you will need to eat more calories. Gaining weight may be necessary if you have a certain health problem that causes your body to need more energy. TIPS Whether you are increasing or decreasing the number of calories you eat during a day, it may be hard to get used to changes in what you eat and drink. The following are tips to help you keep track of the number of calories you eat. Measure foods at home with measuring cups. This helps you know the amount of food and number of calories you are eating. Restaurants often serve food in amounts that are larger than 1 serving. While eating out, estimate how many servings of a food you are given. For example, a serving of cooked rice is  cup or about the size of half of a fist. Knowing serving sizes will help you be aware of how much food you are eating at restaurants. Ask for smaller portion sizes or child-size portions at restaurants. Plan to eat half of a meal at a restaurant. Take the rest home or share the other half with a friend. Read the Nutrition Facts panel on food labels  for calorie content and serving size. You can find out how many servings are in a package, the size of a serving, and the number of calories each serving has. For example, a package might contain 3 cookies. The Nutrition Facts panel on that package says that 1 serving is 1 cookie. Below that, it will say there are 3 servings in the container. The calories section of the Nutrition Facts label says there are 90 calories. This means there are 90 calories in 1 cookie (1 serving). If you eat 1 cookie you have eaten 90 calories. If you eat all 3 cookies, you have eaten 270 calories (3 servings x 90 calories = 270 calories). The list below tells you how big or small some common portion sizes are. 1 oz.........4 stacked dice. 3 oz........Marland Kitchen.Deck of cards. 1 tsp.......Marland Kitchen.Tip of little finger. 1 tbs......Marland Kitchen.Marland Kitchen.Thumb. 2 tbs.......Marland Kitchen.Golf ball.  cup......Marland Kitchen.Half of a fist. 1 cup.......Marland Kitchen.A fist. KEEP A FOOD LOG Write down every food item you eat, the amount you eat, and the number of calories in each food you eat during the day. At the end of the day, you can add up the total number of calories you have eaten. It may help to keep a list like the one below. Find out the calorie information by reading the Nutrition Facts panel on food labels. Breakfast Bran cereal (1 cup, 110 calories).  Fat-free milk ( cup, 45 calories). Snack Apple (1 medium, 80 calories). Lunch Spinach (1 cup, 20 calories). Tomato ( medium, 20 calories). Chicken breast strips (3 oz, 165 calories). Shredded cheddar cheese ( cup, 110 calories). Light [place] dressing (2 tbs, 60 calories). Whole-wheat bread (1 slice, 80 calories). Tub margarine (1 tsp, 35 calories). Vegetable soup (1 cup, 160 calories). Dinner Pork chop (3 oz, 190 calories). Brown rice (1 cup, 215 calories). Steamed broccoli ( cup, 20 calories). Strawberries (1  cup, 65 calories). Whipped cream (1 tbs, 50 calories). Daily Calorie Total: 1425 Document Released: 10/31/2005  Document Revised: 01/23/2012 Document Reviewed: 04/27/2007 [facility] Patient Information 2014 [place], [place]. Polycystic Ovarian Syndrome Polycystic ovarian syndrome (PCOS) is a common hormonal disorder among women of reproductive age. Most women with PCOS grow many small cysts on their ovaries. PCOS can cause problems with your periods and make it difficult to get pregnant. It can also cause an increased risk of miscarriage with pregnancy. If left untreated, PCOS can lead to serious health problems, such as diabetes and heart disease. CAUSES The cause of PCOS is not fully understood, but genetics may be a factor. SIGNS AND SYMPTOMS   Infrequent or no menstrual periods.   Inability to get pregnant (infertility) because of not ovulating.   Increased growth of hair on the face, chest, stomach, back, thumbs, thighs, or toes.   Acne, oily skin, or dandruff.   Pelvic pain.   Weight gain or obesity, usually carrying extra weight around the waist.   Type 2 diabetes.   High cholesterol.   High blood pressure.   Female-pattern baldness or thinning hair.   Patches of thickened and dark brown or black skin on the neck, arms, breasts, or thighs.   Tiny excess flaps of skin (skin tags) in the armpits or neck area.   Excessive snoring and having breathing stop at times while asleep (sleep apnea).   Deepening of the voice.   Gestational diabetes when pregnant.  DIAGNOSIS  There is no single test to diagnose PCOS.   Your health care provider will:   Take a medical history.   Perform a pelvic exam.   Have ultrasonography done.   Check your female and female hormone levels.   Measure glucose or sugar levels in the blood.   Do other blood tests.   If you are producing too many female hormones, your health care provider will make sure it is from PCOS. At the physical exam, your health care provider will want to evaluate the areas of increased hair growth. Try to  allow natural hair growth for a few days before the visit.   During a pelvic exam, the ovaries may be enlarged or swollen because of the increased number of small cysts. This can be seen more easily by using vaginal ultrasonography or screening to examine the ovaries and lining of the uterus (endometrium) for cysts. The uterine lining may become thicker if you have not been having a regular period.  TREATMENT  Because there is no cure for PCOS, it needs to be managed to prevent problems. Treatments are based on your symptoms. Treatment is also based on whether you want to have a baby or whether you need contraception.  Treatment may include:   Progesterone hormone to start a menstrual period.   Birth control pills to make you have regular menstrual periods.   Medicines to make you ovulate, if you want to get pregnant.   Medicines to control your insulin.   Medicine  to control your blood pressure.   Medicine and diet to control your high cholesterol and triglycerides in your blood.  Medicine to reduce excessive hair growth.  Surgery, making small holes in the ovary, to decrease the amount of female hormone production. This is done through a long, lighted tube (laparoscope) placed into the pelvis through a tiny incision in the lower abdomen.  HOME CARE INSTRUCTIONS  Only take over-the-counter or prescription medicine as directed by your health care provider.  Pay attention to the foods you eat and your activity levels. This can help reduce the effects of PCOS.  Keep your weight under control.  Eat foods that are low in carbohydrate and high in fiber.  Exercise regularly. SEEK MEDICAL CARE IF:  Your symptoms do not get better with medicine.  You have new symptoms. Document Released: 02/24/2005 Document Revised: 08/21/2013 Document Reviewed: 04/18/2013 [facility] Patient Information 2014 [place], [place]. Start minastrin today  Return in 3 months

## 2013-12-17 NOTE — Progress Notes (Signed)
Subjective:     Patient ID: Alois Clicheeresa R Williams, female   DOB: 02/28/1986, 28 y.o.   MRN: [id number]  HPI Rosey Batheresa is a 28 year old black female in for US and review labs   Review of Systems See HPI Reviewed past medical,surgical, social and family history. Reviewed medications and allergies.     Objective:   Physical Exam BP 112/70  Ht 6' (1.829 m)  Wt 335 lb (151.955 kg)  BMI 45.42 kg/m2  LMP 11/27/2014UPT negative, Reviewed US with pt and Mom, Uterus 6.5 x 3.7 x 3.3 cm, anteverted no myometrial masses noted  Endometrium 14.8 mm, symmetrical, no obvious masses noted within cavity  Right ovary 3.2 x 2.9 x 2.6 cm, ? polycystic appearance (slightly enlarged with multiple follicles noted)  Left ovary 2.9 x 2.2 x 1.8 cm, ?polycystic appearance (slightly enlarged with multiple follicles noted)  No free fluid or adnexal masses noted within pelvis  Technician Comments:  Anteverted uterus no myometrial masses noted, Endometrium 14.838mm, bilateral adnexa appears wnl ?polycystic appearance  Discussed labs and need to lose weight and try OCs to regulate cycle, discussed with Dr Lavena BullionEure,too. Discussed cutting down on carbs and portion size, eating fresh not prepared and increasing activity.Try to lose 1 lb per week.     Assessment:     PCOs Amenorrhea Obesity     Plan:     Rx minastrin x 1 year start today take 1 daily Review handout on PCO and weight loss Follow up in 3 months   Keep period calendar

## 2014-01-01 ENCOUNTER — Emergency Department (HOSPITAL_COMMUNITY)
Admission: EM | Admit: 2014-01-01 | Discharge: 2014-01-01 | Disposition: A | Discharge status: Home / Self Care | Payer: Medicaid Other | Attending: Emergency Medicine | Admitting: Emergency Medicine

## 2014-01-01 ENCOUNTER — Encounter (HOSPITAL_COMMUNITY): Payer: Self-pay | Admitting: Emergency Medicine

## 2014-01-01 DIAGNOSIS — Z8742 Personal history of other diseases of the female genital tract: Secondary | ICD-10-CM | POA: Insufficient documentation

## 2014-01-01 DIAGNOSIS — Z79899 Other long term (current) drug therapy: Secondary | ICD-10-CM | POA: Insufficient documentation

## 2014-01-01 DIAGNOSIS — J45909 Unspecified asthma, uncomplicated: Secondary | ICD-10-CM | POA: Insufficient documentation

## 2014-01-01 DIAGNOSIS — E669 Obesity, unspecified: Secondary | ICD-10-CM | POA: Insufficient documentation

## 2014-01-01 DIAGNOSIS — R42 Dizziness and giddiness: Secondary | ICD-10-CM | POA: Insufficient documentation

## 2014-01-01 DIAGNOSIS — Z3202 Encounter for pregnancy test, result negative: Secondary | ICD-10-CM | POA: Insufficient documentation

## 2014-01-01 DIAGNOSIS — R112 Nausea with vomiting, unspecified: Secondary | ICD-10-CM | POA: Insufficient documentation

## 2014-01-01 DIAGNOSIS — R5383 Other fatigue: Secondary | ICD-10-CM

## 2014-01-01 DIAGNOSIS — R5381 Other malaise: Secondary | ICD-10-CM | POA: Insufficient documentation

## 2014-01-01 LAB — URINALYSIS, ROUTINE W REFLEX MICROSCOPIC
BILIRUBIN URINE: NEGATIVE
Glucose, UA: NEGATIVE mg/dL
Ketones, ur: NEGATIVE mg/dL
Leukocytes, UA: NEGATIVE
NITRITE: NEGATIVE
PROTEIN: NEGATIVE mg/dL
SPECIFIC GRAVITY, URINE: 1.02 (ref 1.005–1.030)
UROBILINOGEN UA: 2 mg/dL — AB (ref 0.0–1.0)
pH: 7 (ref 5.0–8.0)

## 2014-01-01 LAB — URINE MICROSCOPIC-ADD ON

## 2014-01-01 LAB — POCT PREGNANCY, URINE: Preg Test, Ur: NEGATIVE

## 2014-01-01 MED ORDER — ONDANSETRON 8 MG PO TBDP
ORAL_TABLET | ORAL | Status: AC
  Filled 2014-01-01: qty 1

## 2014-01-01 MED ORDER — ONDANSETRON 4 MG PO TBDP: 8.0000 mg | ORAL_TABLET | Freq: Three times a day (TID) | ORAL | Status: DC | PRN

## 2014-01-01 MED ORDER — ONDANSETRON 8 MG PO TBDP
8.0000 mg | ORAL_TABLET | Freq: Once | ORAL | Status: AC
  Administered 2014-01-01: 8 mg via ORAL

## 2014-01-01 NOTE — ED Provider Notes (Signed)
CSN: [id number]     Arrival date & time 01/01/14  0126 History   First MD Initiated Contact with Patient 01/01/14 0207     Chief Complaint  Patient presents with  . Abdominal Pain     (Consider location/radiation/quality/duration/timing/severity/associated sxs/prior Treatment) HPI This is a 28 year old female with a history of obesity and polycystic ovarian disease. She is here with suprapubic abdominal pain that began yesterday afternoon about 5 PM. It has been associated with 2 episodes of emesis. She states the pain as an 8/10, worse with movement or palpation. She denies vaginal bleeding or discharge. She does state she feels dizzy and weak. She has had no diarrhea with this. She states the pain is like that of previous ovarian cysts.  Past Medical History  Diagnosis Date  . Asthma   . Ovarian cyst     Right  . Obesity   . Amenorrhea 12/10/2013  . PCO (polycystic ovaries) 12/17/2013   Past Surgical History  Procedure Laterality Date  . Cyst excision     Family History  Problem Relation Age of Onset  . Hypertension Mother   . Asthma Sister   . Diabetes Maternal Aunt   . Diabetes Maternal Uncle    History  Substance Use Topics  . Smoking status: Never Smoker   . Smokeless tobacco: Never Used  . Alcohol Use: No   OB History   Grav Para Term Preterm Abortions TAB SAB Ect Mult Living   4    4  4         Review of Systems  All other systems reviewed and are negative.   Allergies  Review of patient's allergies indicates no known allergies.  Home Medications   Current Outpatient Rx  Name  Route  Sig  Dispense  Refill  . albuterol (PROVENTIL HFA;VENTOLIN HFA) 108 (90 BASE) MCG/ACT inhaler   Inhalation   Inhale 2 puffs into the lungs every 4 (four) hours as needed for wheezing or shortness of breath.   1 Inhaler   0   . Norethin Ace-Eth Estrad-FE (MINASTRIN 24 FE) 1-20 MG-MCG(24) CHEW   Oral   Chew 1 tablet by mouth daily.   28 tablet   11    BP 140/70   Pulse 115  Temp(Src) 99 F (37.2 C) (Oral)  Resp 18  Ht 6' (1.829 m)  Wt 335 lb (151.955 kg)  BMI 45.42 kg/m2  SpO2 98%  LMP 11/28/2013  Physical Exam General: Well-developed, obese female in no acute distress; appearance consistent with age of record HENT: normocephalic; atraumatic Eyes: pupils equal, round and reactive to light; extraocular muscles intact Neck: supple Heart: regular rate and rhythm Lungs: clear to auscultation bilaterally Abdomen: soft; nondistended; mid abdominal tenderness; bowel sounds present GU:  No CVA tenderness; normal external genitalia; vaginal bleeding; no vaginal discharge; no cervical or MS; no cervical motion tenderness; no adnexal tenderness; no adnexal mass palpated Extremities: No deformity; full range of motion; pulses normal Neurologic: Awake, alert and oriented; motor function intact in all extremities and symmetric; no facial droop Skin: Warm and dry; facial hirsuties Psychiatric: Normal mood and affect    ED Course  Procedures (including critical care time)    MDM   Nursing notes and vitals signs, including pulse oximetry, reviewed.  Summary of this visit's results, reviewed by myself:  Labs:  Results for orders placed during the hospital encounter of 01/01/14 (from the past 24 hour(s))  URINALYSIS, ROUTINE W REFLEX MICROSCOPIC     Status: Abnormal  Collection Time    01/01/14  1:48 AM      Result Value Ref Range   Color, Urine YELLOW  YELLOW   APPearance HAZY (*) CLEAR   Specific Gravity, Urine 1.020  1.005 - 1.030   pH 7.0  5.0 - 8.0   Glucose, UA NEGATIVE  NEGATIVE mg/dL   Hgb urine dipstick LARGE (*) NEGATIVE   Bilirubin Urine NEGATIVE  NEGATIVE   Ketones, ur NEGATIVE  NEGATIVE mg/dL   Protein, ur NEGATIVE  NEGATIVE mg/dL   Urobilinogen, UA 2.0 (*) 0.0 - 1.0 mg/dL   Nitrite NEGATIVE  NEGATIVE   Leukocytes, UA NEGATIVE  NEGATIVE  URINE MICROSCOPIC-ADD ON     Status: Abnormal   Collection Time    01/01/14  1:48 AM       Result Value Ref Range   Squamous Epithelial / LPF FEW (*) RARE   WBC, UA 3-6  <3 WBC/hpf   RBC / HPF 11-20  <3 RBC/hpf   Bacteria, UA FEW (*) RARE  POCT PREGNANCY, URINE     Status: None   Collection Time    01/01/14  1:50 AM      Result Value Ref Range   Preg Test, Ur NEGATIVE  NEGATIVE   3:35 AM Patient sleeping but arousable. Has been drinking fluids without emesis following Zofran ODT. Pain is down to a 3/10. Abdomen soft, minimally tender.    Hanley Seamen, MD 01/01/14 [id number]

## 2014-01-01 NOTE — ED Notes (Addendum)
Vomited in toilet, clear to light green liquid.medicated for same with zofran ODT.  Set up for pelvic exam

## 2014-01-01 NOTE — ED Notes (Signed)
Has tolerated a can of gingerale and is now sleeping

## 2014-01-01 NOTE — ED Notes (Signed)
Pt c/o abd pain and vomited x 2.  Pt states she also feels dizzy and weak.

## 2014-01-01 NOTE — Discharge Instructions (Signed)

## 2014-01-02 LAB — GC/CHLAMYDIA PROBE AMP
CT PROBE, AMP APTIMA: NEGATIVE
GC Probe RNA: NEGATIVE

## 2014-02-12 ENCOUNTER — Ambulatory Visit: Payer: Medicaid Other | Admitting: Women's Health

## 2014-03-05 ENCOUNTER — Emergency Department (HOSPITAL_COMMUNITY)
Admission: EM | Admit: 2014-03-05 | Discharge: 2014-03-05 | Disposition: A | Discharge status: Home / Self Care | Payer: Medicaid Other | Attending: Emergency Medicine | Admitting: Emergency Medicine

## 2014-03-05 ENCOUNTER — Emergency Department (HOSPITAL_COMMUNITY): Payer: Medicaid Other

## 2014-03-05 ENCOUNTER — Encounter (HOSPITAL_COMMUNITY): Payer: Self-pay | Admitting: Emergency Medicine

## 2014-03-05 DIAGNOSIS — G8911 Acute pain due to trauma: Secondary | ICD-10-CM | POA: Insufficient documentation

## 2014-03-05 DIAGNOSIS — J45909 Unspecified asthma, uncomplicated: Secondary | ICD-10-CM | POA: Insufficient documentation

## 2014-03-05 DIAGNOSIS — Z8742 Personal history of other diseases of the female genital tract: Secondary | ICD-10-CM | POA: Insufficient documentation

## 2014-03-05 DIAGNOSIS — M25519 Pain in unspecified shoulder: Secondary | ICD-10-CM | POA: Insufficient documentation

## 2014-03-05 DIAGNOSIS — E669 Obesity, unspecified: Secondary | ICD-10-CM | POA: Insufficient documentation

## 2014-03-05 DIAGNOSIS — Z79899 Other long term (current) drug therapy: Secondary | ICD-10-CM | POA: Insufficient documentation

## 2014-03-05 MED ORDER — IBUPROFEN 600 MG PO TABS: 600.0000 mg | ORAL_TABLET | Freq: Four times a day (QID) | ORAL | Status: DC | PRN

## 2014-03-05 NOTE — ED Notes (Signed)
Pt received discharge instructions and prescriptions, verbalized understanding and has no further questions. Pt ambulated to exit in stable condition accompanied by mother.

## 2014-03-05 NOTE — Discharge Instructions (Signed)
Shoulder Pain The shoulder is the joint that connects your arms to your body. The bones that form the shoulder joint include the upper arm bone (humerus), the shoulder blade (scapula), and the collarbone (clavicle). The top of the humerus is shaped like a ball and fits into a rather flat socket on the scapula (glenoid cavity). A combination of muscles and strong, fibrous tissues that connect muscles to bones (tendons) support your shoulder joint and hold the ball in the socket. Small, fluid-filled sacs (bursae) are located in different areas of the joint. They act as cushions between the bones and the overlying soft tissues and help reduce friction between the gliding tendons and the bone as you move your arm. Your shoulder joint allows a wide range of motion in your arm. This range of motion allows you to do things like scratch your back or throw a ball. However, this range of motion also makes your shoulder more prone to pain from overuse and injury. Causes of shoulder pain can originate from both injury and overuse and usually can be grouped in the following four categories:  Redness, swelling, and pain (inflammation) of the tendon (tendinitis) or the bursae (bursitis).  Instability, such as a dislocation of the joint.  Inflammation of the joint (arthritis).  Broken bone (fracture). HOME CARE INSTRUCTIONS   Apply ice to the sore area.  Put ice in a plastic bag.  Place a towel between your skin and the bag.  Leave the ice on for 15-20 minutes, 03-04 times per day for the first 2 days.  Stop using cold packs if they do not help with the pain.  If you have a shoulder sling or immobilizer, wear it as long as your caregiver instructs. Only remove it to shower or bathe. Move your arm as little as possible, but keep your hand moving to prevent swelling.  Squeeze a soft ball or foam pad as much as possible to help prevent swelling.  Only take over-the-counter or prescription medicines for pain,  discomfort, or fever as directed by your caregiver. SEEK MEDICAL CARE IF:   Your shoulder pain increases, or new pain develops in your arm, hand, or fingers.  Your hand or fingers become cold and numb.  Your pain is not relieved with medicines. SEEK IMMEDIATE MEDICAL CARE IF:   Your arm, hand, or fingers are numb or tingling.  Your arm, hand, or fingers are significantly swollen or turn white or blue. MAKE SURE YOU:   Understand these instructions.  Will watch your condition.  Will get help right away if you are not doing well or get worse. Document Released: 08/10/2005 Document Revised: 07/25/2012 Document Reviewed: 10/15/2011 [facility] Patient Information 2014 [place], [place].   Your xray is negative today.   Use the ibuprofen for pain and inflammation.  You may apply a heating pad 20 minutes several times daily which may help relieve your pain.

## 2014-03-05 NOTE — ED Notes (Signed)
Pt c/o right shoulder pain x1 week. Pt reports injury to shoulder over a year ago and states she may have "aggrevated the shoulder" from heavy lifting last week.

## 2014-03-06 ENCOUNTER — Telehealth: Payer: Self-pay | Admitting: Orthopedic Surgery

## 2014-03-06 NOTE — Telephone Encounter (Signed)
[facility].  Offered appointment for first available, (in 10 days, sooner if we have cancellation);  and patient states she will decide and call back if wishes to schedule.

## 2014-03-08 NOTE — ED Provider Notes (Signed)
CSN: [id number]     Arrival date & time 03/05/14  1507 History   First MD Initiated Contact with Patient 03/05/14 1521     Chief Complaint  Patient presents with  . Shoulder Pain     (Consider location/radiation/quality/duration/timing/severity/associated sxs/prior Treatment) Patient is a 28 y.o. female presenting with shoulder pain. The history is provided by the patient and a parent.  Shoulder Pain This is a recurrent problem. The current episode started in the past 7 days (acute on chronic intermittent right shoulder pain.  she reports hurting her shoulder at work one week ago lifting a heavy object.). The problem occurs constantly. The problem has been gradually worsening. Associated symptoms include arthralgias. Pertinent negatives include no abdominal pain, chest pain, chills, congestion, coughing, diaphoresis, fatigue, fever, joint swelling, myalgias, nausea, neck pain, numbness, rash, swollen glands or weakness. Exacerbated by: movement. She has tried nothing for the symptoms. The treatment provided no relief.    Past Medical History  Diagnosis Date  . Asthma   . Ovarian cyst     Right  . Obesity   . Amenorrhea 12/10/2013  . PCO (polycystic ovaries) 12/17/2013   Past Surgical History  Procedure Laterality Date  . Cyst excision     Family History  Problem Relation Age of Onset  . Hypertension Mother   . Asthma Sister   . Diabetes Maternal Aunt   . Diabetes Maternal Uncle    History  Substance Use Topics  . Smoking status: Never Smoker   . Smokeless tobacco: Never Used  . Alcohol Use: No   OB History   Grav Para Term Preterm Abortions TAB SAB Ect Mult Living   4    4  4         Review of Systems  Constitutional: Negative for fever, chills, diaphoresis and fatigue.  HENT: Negative for congestion.   Respiratory: Negative for cough.   Cardiovascular: Negative for chest pain.  Gastrointestinal: Negative for nausea and abdominal pain.  Musculoskeletal: Positive for  arthralgias. Negative for joint swelling, myalgias and neck pain.  Skin: Negative for rash.  Neurological: Negative for weakness and numbness.      Allergies  Review of patient's allergies indicates no known allergies.  Home Medications   Prior to Admission medications   Medication Sig Start Date End Date Taking? Authorizing Provider  albuterol (PROVENTIL HFA;VENTOLIN HFA) 108 (90 BASE) MCG/ACT inhaler Inhale 2 puffs into the lungs every 4 (four) hours as needed for wheezing or shortness of breath. 04/27/13   Juliet Rude. Pickering, MD  ibuprofen (ADVIL,MOTRIN) 600 MG tablet Take 1 tablet (600 mg total) by mouth every 6 (six) hours as needed. 03/05/14   Burgess Amor, PA-C  Norethin Ace-Eth Estrad-FE (MINASTRIN 24 FE) 1-20 MG-MCG(24) CHEW Chew 1 tablet by mouth daily. 12/17/13   Adline Potter, NP  ondansetron (ZOFRAN ODT) 4 MG disintegrating tablet Take 2 tablets (8 mg total) by mouth every 8 (eight) hours as needed for nausea or vomiting. 4mg  ODT q4 hours prn nausea/vomit 01/01/14   John L Molpus, MD   BP 120/67  Pulse 78  Temp(Src) 97.9 F (36.6 C) (Oral)  Resp 18  SpO2 98%  LMP 01/26/2014 Physical Exam  Constitutional: She appears well-developed and well-nourished.  HENT:  Head: Atraumatic.  Neck: Normal range of motion.  Cardiovascular:  Pulses equal bilaterally  Musculoskeletal: She exhibits tenderness. She exhibits no edema.       Right shoulder: She exhibits bony tenderness. She exhibits normal range of motion, no swelling,  no effusion, no crepitus, no deformity, no spasm, normal pulse and normal strength.  TTP right shoulder over lateral humeral head. No visible trauma, edema, no palpable deformity. Pain with both active and passive abduction, flexion of the shoulder.  No crepitus.  Neurological: She is alert. She has normal strength. She displays normal reflexes. No sensory deficit.  Skin: Skin is warm and dry.  Psychiatric: She has a normal mood and affect.    ED Course   Procedures (including critical care time) Labs Review Labs Reviewed - No data to display  Imaging Review No results found.   EKG Interpretation None      MDM   Final diagnoses:  Shoulder pain, acute    Patients labs and/or radiological studies were viewed and considered during the medical decision making and disposition process. Pt was prescribed ibuprofen, advised ice tx, can add heat in several days. Referral to ortho for a recheck if sx not improved over the next week week.  The patient appears reasonably screened and/or stabilized for discharge and I doubt any other medical condition or other [facility] requiring further screening, evaluation, or treatment in the ED at this time prior to discharge.     Burgess AmorJulie Marian Meneely, PA-C 03/08/14 0041

## 2014-03-08 NOTE — ED Provider Notes (Signed)
Medical screening examination/treatment/procedure(s) were performed by non-physician practitioner and as supervising physician I was immediately available for consultation/collaboration.   EKG Interpretation None        Jerryl Holzhauer, MD 03/08/14 1339 

## 2014-03-17 ENCOUNTER — Ambulatory Visit: Payer: Medicaid Other | Admitting: Adult Health

## 2014-06-09 ENCOUNTER — Emergency Department (HOSPITAL_COMMUNITY)
Admission: EM | Admit: 2014-06-09 | Discharge: 2014-06-09 | Disposition: A | Discharge status: Home / Self Care | Payer: Medicaid Other | Attending: Emergency Medicine | Admitting: Emergency Medicine

## 2014-06-09 ENCOUNTER — Encounter (HOSPITAL_COMMUNITY): Payer: Self-pay | Admitting: Emergency Medicine

## 2014-06-09 DIAGNOSIS — J45909 Unspecified asthma, uncomplicated: Secondary | ICD-10-CM | POA: Diagnosis not present

## 2014-06-09 DIAGNOSIS — Z791 Long term (current) use of non-steroidal anti-inflammatories (NSAID): Secondary | ICD-10-CM | POA: Diagnosis not present

## 2014-06-09 DIAGNOSIS — J029 Acute pharyngitis, unspecified: Secondary | ICD-10-CM | POA: Diagnosis present

## 2014-06-09 DIAGNOSIS — Z79899 Other long term (current) drug therapy: Secondary | ICD-10-CM | POA: Insufficient documentation

## 2014-06-09 DIAGNOSIS — J039 Acute tonsillitis, unspecified: Secondary | ICD-10-CM | POA: Diagnosis not present

## 2014-06-09 DIAGNOSIS — Z8742 Personal history of other diseases of the female genital tract: Secondary | ICD-10-CM | POA: Diagnosis not present

## 2014-06-09 DIAGNOSIS — E669 Obesity, unspecified: Secondary | ICD-10-CM | POA: Insufficient documentation

## 2014-06-09 MED ORDER — MAGIC MOUTHWASH W/LIDOCAINE: 5.0000 mL | Freq: Three times a day (TID) | ORAL | Status: DC | PRN

## 2014-06-09 MED ORDER — AMOXICILLIN 500 MG PO CAPS: 500.0000 mg | ORAL_CAPSULE | Freq: Three times a day (TID) | ORAL | Status: DC

## 2014-06-09 NOTE — ED Notes (Signed)
Sore throat since Friday.Denies fever. Denies any other S&S. No home treatments attempted.

## 2014-06-09 NOTE — Discharge Instructions (Signed)
Tonsillitis °Tonsillitis is an infection of the throat. This infection causes the tonsils to become red, tender, and puffy (swollen). Tonsils are groups of tissue at the back of your throat. If bacteria caused your infection, antibiotic medicine will be given to you. Sometimes symptoms of tonsillitis can be relieved with the use of steroid medicine. If your tonsillitis is severe and happens often, you may need to get your tonsils removed (tonsillectomy). °HOME CARE  °· Rest and sleep often. °· Drink enough fluids to keep your pee (urine) clear or pale yellow. °· While your throat is sore, eat soft or liquid foods like: °¨ Soup. °¨ Ice cream. °¨ Instant breakfast drinks. °· Eat frozen ice pops. °· Gargle with a warm or cold liquid to help soothe the throat. Gargle with a water and salt mix. Mix 1/4 teaspoon of salt and 1/4 teaspoon of baking soda in 1 cup of water. °· Only take medicines as told by your doctor. °· If you are given medicines (antibiotics), take them as told. Finish them even if you start to feel better. °GET HELP IF: °· You have large, tender lumps in your neck. °· You have a rash. °· You cough up green, yellow-brown, or bloody fluid. °· You cannot swallow liquids or food for 24 hours. °· You notice that only one of your tonsils is swollen. °GET HELP RIGHT AWAY IF:  °· You throw up (vomit). °· You have a very bad headache. °· You have a stiff neck. °· You have chest pain. °· You have trouble breathing or swallowing. °· You have bad throat pain, drooling, or your voice changes. °· You have bad pain not helped by medicine. °· You cannot fully open your mouth. °· You have redness, puffiness, or bad pain in the neck. °· You have a fever. °MAKE SURE YOU:  °· Understand these instructions. °· Will watch your condition. °· Will get help right away if you are not doing well or get worse. °Document Released: 04/18/2008 Document Revised: 11/05/2013 Document Reviewed: 04/19/2013 °ExitCare® Patient Information  ©2015 [employer]. This information is not intended to replace advice given to you by your health care provider. Make sure you discuss any questions you have with your health care provider. ° °

## 2014-06-09 NOTE — ED Provider Notes (Signed)
CSN: [id number]     Arrival date & time 06/09/14  1636 History   First MD Initiated Contact with Patient 06/09/14 1646    This chart was scribed for non-physician practitioner Pauline Aus, PA-C working with Glynn Octave, MD, by Andrew Au, ED Scribe. This patient was seen in room APFT22/APFT22 and the patient's care was started at 5:06 PM. Chief Complaint  Patient presents with  . Sore Throat   Patient is a 28 y.o. female presenting with pharyngitis. The history is provided by the patient. No language interpreter was used.  Sore Throat   Brandi Hurley is a 28 y.o. female who presents to the Emergency Department complaining of a worsening constant sore throat x 3 days with associated pain with swallowing. Pt denies taking medication. Pt denies exposure to strep or sick contact. Pt denies otalgia, emesis and fever. Pt denies being allergic to medication.   Past Medical History  Diagnosis Date  . Asthma   . Ovarian cyst     Right  . Obesity   . Amenorrhea 12/10/2013  . PCO (polycystic ovaries) 12/17/2013   Past Surgical History  Procedure Laterality Date  . Cyst excision     Family History  Problem Relation Age of Onset  . Hypertension Mother   . Asthma Sister   . Diabetes Maternal Aunt   . Diabetes Maternal Uncle    History  Substance Use Topics  . Smoking status: Never Smoker   . Smokeless tobacco: Never Used  . Alcohol Use: No   OB History   Grav Para Term Preterm Abortions TAB SAB Ect Mult Living   4    4  4         Review of Systems  Constitutional: Negative for fever and chills.  HENT: Positive for sore throat. Negative for ear pain, trouble swallowing and voice change.   Respiratory: Negative for cough.   Gastrointestinal: Negative for nausea, vomiting and diarrhea.  Musculoskeletal: Negative for neck pain and neck stiffness.  Skin: Negative for color change and rash.  Hematological: Negative for adenopathy.  All other systems reviewed and are  negative.  Allergies  Review of patient's allergies indicates no known allergies.  Home Medications   Prior to Admission medications   Medication Sig Start Date End Date Taking? Authorizing Provider  albuterol (PROVENTIL HFA;VENTOLIN HFA) 108 (90 BASE) MCG/ACT inhaler Inhale 2 puffs into the lungs every 4 (four) hours as needed for wheezing or shortness of breath. 04/27/13   Juliet Rude. Pickering, MD  ibuprofen (ADVIL,MOTRIN) 600 MG tablet Take 1 tablet (600 mg total) by mouth every 6 (six) hours as needed. 03/05/14   Burgess Amor, PA-C  Norethin Ace-Eth Estrad-FE (MINASTRIN 24 FE) 1-20 MG-MCG(24) CHEW Chew 1 tablet by mouth daily. 12/17/13   Adline Potter, NP  ondansetron (ZOFRAN ODT) 4 MG disintegrating tablet Take 2 tablets (8 mg total) by mouth every 8 (eight) hours as needed for nausea or vomiting. 4mg  ODT q4 hours prn nausea/vomit 01/01/14   John L Molpus, MD   BP 130/74  Pulse 78  Temp(Src) 97.8 F (36.6 C) (Oral)  Resp 16  Ht 5\' 9"  (1.753 m)  Wt 320 lb (145.151 kg)  BMI 47.23 kg/m2  SpO2 98%  LMP 02/07/2014 Physical Exam  Nursing note and vitals reviewed. Constitutional: She is oriented to person, place, and time. She appears well-developed and well-nourished. No distress.  HENT:  Head: Normocephalic and atraumatic.  Right Ear: Tympanic membrane and ear canal normal.  Left Ear:  Tympanic membrane and ear canal normal.  Mouth/Throat: Uvula is midline and mucous membranes are normal. No trismus in the jaw. No uvula swelling. Oropharyngeal exudate, posterior oropharyngeal edema and posterior oropharyngeal erythema present. No tonsillar abscesses.  Soft tissue swelling of the bilateral tonsils with tonsillar exudates. Airway is patent. No PTA .  Uvula is midline  Eyes: Conjunctivae and EOM are normal.  Neck: Normal range of motion. Neck supple.  Cardiovascular: Normal rate, regular rhythm and normal heart sounds.  Exam reveals no gallop and no friction rub.   No murmur  heard. Pulmonary/Chest: Effort normal and breath sounds normal. No respiratory distress. She has no wheezes. She has no rales. She exhibits no tenderness.  Abdominal: There is no splenomegaly. There is no tenderness.  Musculoskeletal: Normal range of motion.  Lymphadenopathy:    She has no cervical adenopathy.  Neurological: She is alert and oriented to person, place, and time. She exhibits normal muscle tone. Coordination normal.  Skin: Skin is warm and dry.  Psychiatric: She has a normal mood and affect. Her behavior is normal.    ED Course  Procedures (including critical care time) DIAGNOSTIC STUDIES: Oxygen Saturation is 98% on RA, normal by my interpretation.    COORDINATION OF CARE: 5:11 PM- Pt advised of plan for treatment which includes amoxicillin and pt agrees.  Labs Review Labs Reviewed - No data to display  Imaging Review No results found.   EKG Interpretation None      MDM   Final diagnoses:  Tonsillitis    Pt is well appearing,  No PTA.  Uvula midline.  Tonsil exudates and edema bilaterally.  Airway patent.  She agrees to fluids, ibuprofen for fever, amoxil and magic mouthwash.  She appears stable for d/c  I personally performed the services described in this documentation, which was scribed in my presence. The recorded information has been reviewed and is accurate.      Zanna Hawn L. Suhailah Kwan, PA-C 06/11/14 1703

## 2014-06-11 NOTE — ED Provider Notes (Signed)
Medical screening examination/treatment/procedure(s) were performed by non-physician practitioner and as supervising physician I was immediately available for consultation/collaboration.   EKG Interpretation None        Glynn OctaveStephen Jayda White, MD 06/11/14 [phone]

## 2014-07-24 ENCOUNTER — Emergency Department (HOSPITAL_COMMUNITY)
Admission: EM | Admit: 2014-07-24 | Discharge: 2014-07-24 | Disposition: A | Discharge status: Home / Self Care | Payer: Medicaid Other | Attending: Emergency Medicine | Admitting: Emergency Medicine

## 2014-07-24 ENCOUNTER — Encounter (HOSPITAL_COMMUNITY): Payer: Self-pay | Admitting: Emergency Medicine

## 2014-07-24 DIAGNOSIS — R0602 Shortness of breath: Secondary | ICD-10-CM | POA: Insufficient documentation

## 2014-07-24 DIAGNOSIS — Z79899 Other long term (current) drug therapy: Secondary | ICD-10-CM | POA: Insufficient documentation

## 2014-07-24 DIAGNOSIS — Z792 Long term (current) use of antibiotics: Secondary | ICD-10-CM | POA: Insufficient documentation

## 2014-07-24 DIAGNOSIS — J45901 Unspecified asthma with (acute) exacerbation: Secondary | ICD-10-CM | POA: Insufficient documentation

## 2014-07-24 DIAGNOSIS — Z8742 Personal history of other diseases of the female genital tract: Secondary | ICD-10-CM | POA: Diagnosis not present

## 2014-07-24 DIAGNOSIS — E669 Obesity, unspecified: Secondary | ICD-10-CM | POA: Insufficient documentation

## 2014-07-24 DIAGNOSIS — J9801 Acute bronchospasm: Secondary | ICD-10-CM

## 2014-07-24 MED ORDER — ALBUTEROL SULFATE HFA 108 (90 BASE) MCG/ACT IN AERS: 2.0000 | INHALATION_SPRAY | RESPIRATORY_TRACT | Status: DC | PRN

## 2014-07-24 MED ORDER — ALBUTEROL SULFATE HFA 108 (90 BASE) MCG/ACT IN AERS
2.0000 | INHALATION_SPRAY | Freq: Once | RESPIRATORY_TRACT | Status: AC
  Administered 2014-07-24: 2 via RESPIRATORY_TRACT
  Filled 2014-07-24: qty 6.7
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
## 2014-07-24 NOTE — Discharge Instructions (Signed)
Bronchospasm °A bronchospasm is a spasm or tightening of the airways going into the lungs. During a bronchospasm breathing becomes more difficult because the airways get smaller. When this happens there can be coughing, a whistling sound when breathing (wheezing), and difficulty breathing. Bronchospasm is often associated with asthma, but not all patients who experience a bronchospasm have asthma. °CAUSES  °A bronchospasm is caused by inflammation or irritation of the airways. The inflammation or irritation may be triggered by:  °· Allergies (such as to animals, pollen, food, or mold). Allergens that cause bronchospasm may cause wheezing immediately after exposure or many hours later.   °· Infection. Viral infections are believed to be the most common cause of bronchospasm.   °· Exercise.   °· Irritants (such as pollution, cigarette smoke, strong odors, aerosol sprays, and paint fumes).   °· Weather changes. Winds increase molds and pollens in the air. Rain refreshes the air by washing irritants out. Cold air may cause inflammation.   °· Stress and emotional upset.   °SIGNS AND SYMPTOMS  °· Wheezing.   °· Excessive nighttime coughing.   °· Frequent or severe coughing with a simple cold.   °· Chest tightness.   °· Shortness of breath.   °DIAGNOSIS  °Bronchospasm is usually diagnosed through a history and physical exam. Tests, such as chest X-rays, are sometimes done to look for other conditions. °TREATMENT  °· Inhaled medicines can be given to open up your airways and help you breathe. The medicines can be given using either an inhaler or a nebulizer machine. °· Corticosteroid medicines may be given for severe bronchospasm, usually when it is associated with asthma. °HOME CARE INSTRUCTIONS  °· Always have a plan prepared for seeking medical care. Know when to call your health care provider and local emergency services (911 in the [place] Know where you can access local emergency care. °· Only take medicines as  directed by your health care provider. °· If you were prescribed an inhaler or nebulizer machine, ask your health care provider to explain how to use it correctly. Always use a spacer with your inhaler if you were given one. °· It is necessary to remain calm during an attack. Try to relax and breathe more slowly.  °· Control your home environment in the following ways:   °¨ Change your heating and air conditioning filter at least once a month.   °¨ Limit your use of fireplaces and wood stoves. °¨ Do not smoke and do not allow smoking in your home.   °¨ Avoid exposure to perfumes and fragrances.   °¨ Get rid of pests (such as roaches and mice) and their droppings.   °¨ Throw away plants if you see mold on them.   °¨ Keep your house clean and dust free.   °¨ Replace carpet with wood, tile, or vinyl flooring. Carpet can trap dander and dust.   °¨ Use allergy-proof pillows, mattress covers, and box spring covers.   °¨ Wash bed sheets and blankets every week in hot water and dry them in a dryer.   °¨ Use blankets that are made of polyester or cotton.   °¨ Wash hands frequently. °SEEK MEDICAL CARE IF:  °· You have muscle aches.   °· You have chest pain.   °· The sputum changes from clear or white to yellow, green, gray, or bloody.   °· The sputum you cough up gets thicker.   °· There are problems that may be related to the medicine you are given, such as a rash, itching, swelling, or trouble breathing.   °SEEK IMMEDIATE MEDICAL CARE IF:  °· You have worsening wheezing and coughing even   after taking your prescribed medicines.   °· You have increased difficulty breathing.   °· You develop severe chest pain. °MAKE SURE YOU:  °· Understand these instructions. °· Will watch your condition. °· Will get help right away if you are not doing well or get worse. °Document Released: 11/03/2003 Document Revised: 11/05/2013 Document Reviewed: 04/22/2013 °ExitCare® Patient Information ©2015 [employer]. This information is not  intended to replace advice given to you by your health care provider. Make sure you discuss any questions you have with your health care provider. ° °

## 2014-08-09 ENCOUNTER — Encounter (HOSPITAL_COMMUNITY): Payer: Self-pay | Admitting: Emergency Medicine

## 2014-08-09 ENCOUNTER — Emergency Department (HOSPITAL_COMMUNITY)
Admission: EM | Admit: 2014-08-09 | Discharge: 2014-08-09 | Disposition: A | Discharge status: Home / Self Care | Payer: Medicaid Other | Attending: Emergency Medicine | Admitting: Emergency Medicine

## 2014-08-09 DIAGNOSIS — Z3202 Encounter for pregnancy test, result negative: Secondary | ICD-10-CM | POA: Diagnosis not present

## 2014-08-09 DIAGNOSIS — E669 Obesity, unspecified: Secondary | ICD-10-CM | POA: Insufficient documentation

## 2014-08-09 DIAGNOSIS — J45909 Unspecified asthma, uncomplicated: Secondary | ICD-10-CM | POA: Diagnosis not present

## 2014-08-09 DIAGNOSIS — R109 Unspecified abdominal pain: Secondary | ICD-10-CM | POA: Diagnosis present

## 2014-08-09 DIAGNOSIS — Z79899 Other long term (current) drug therapy: Secondary | ICD-10-CM | POA: Diagnosis not present

## 2014-08-09 DIAGNOSIS — L0291 Cutaneous abscess, unspecified: Secondary | ICD-10-CM

## 2014-08-09 DIAGNOSIS — N760 Acute vaginitis: Secondary | ICD-10-CM | POA: Insufficient documentation

## 2014-08-09 LAB — URINALYSIS, ROUTINE W REFLEX MICROSCOPIC
Bilirubin Urine: NEGATIVE
Glucose, UA: NEGATIVE mg/dL
Hgb urine dipstick: NEGATIVE
Ketones, ur: NEGATIVE mg/dL
Leukocytes, UA: NEGATIVE
Nitrite: NEGATIVE
PROTEIN: NEGATIVE mg/dL
Specific Gravity, Urine: <1.005 — ABNORMAL LOW (ref 1.005–1.030)
UROBILINOGEN UA: 0.2 mg/dL (ref 0.0–1.0)
pH: 7 (ref 5.0–8.0)

## 2014-08-09 LAB — CBC WITH DIFFERENTIAL/PLATELET
BASOS ABS: 0 10*3/uL (ref 0.0–0.1)
Basophils Relative: 0 % (ref 0–1)
EOS ABS: 0.1 10*3/uL (ref 0.0–0.7)
EOS PCT: 1 % (ref 0–5)
HCT: 41 % (ref 36.0–46.0)
HEMOGLOBIN: 13.3 g/dL (ref 12.0–15.0)
Lymphocytes Relative: 42 % (ref 12–46)
Lymphs Abs: 3.2 10*3/uL (ref 0.7–4.0)
MCH: 25 pg — ABNORMAL LOW (ref 26.0–34.0)
MCHC: 32.4 g/dL (ref 30.0–36.0)
MCV: 77.2 fL — AB (ref 78.0–100.0)
MONO ABS: 0.5 10*3/uL (ref 0.1–1.0)
Monocytes Relative: 6 % (ref 3–12)
Neutro Abs: 3.9 10*3/uL (ref 1.7–7.7)
Neutrophils Relative %: 51 % (ref 43–77)
Platelets: 356 10*3/uL (ref 150–400)
RBC: 5.31 MIL/uL — ABNORMAL HIGH (ref 3.87–5.11)
RDW: 15.9 % — AB (ref 11.5–15.5)
WBC: 7.7 10*3/uL (ref 4.0–10.5)

## 2014-08-09 LAB — COMPREHENSIVE METABOLIC PANEL
ALBUMIN: 3.6 g/dL (ref 3.5–5.2)
ALT: 20 U/L (ref 0–35)
ANION GAP: 9 (ref 5–15)
AST: 14 U/L (ref 0–37)
Alkaline Phosphatase: 79 U/L (ref 39–117)
BILIRUBIN TOTAL: 0.2 mg/dL — AB (ref 0.3–1.2)
BUN: 8 mg/dL (ref 6–23)
CALCIUM: 9.7 mg/dL (ref 8.4–10.5)
CHLORIDE: 98 meq/L (ref 96–112)
CO2: 29 mEq/L (ref 19–32)
CREATININE: 0.66 mg/dL (ref 0.50–1.10)
GFR calc Af Amer: >90 mL/min (ref >90)
GFR calc non Af Amer: >90 mL/min (ref >90)
Glucose, Bld: 78 mg/dL (ref 70–99)
Potassium: 4.1 mEq/L (ref 3.7–5.3)
Sodium: 136 mEq/L — ABNORMAL LOW (ref 137–147)
TOTAL PROTEIN: 9 g/dL — AB (ref 6.0–8.3)

## 2014-08-09 LAB — PREGNANCY, URINE: PREG TEST UR: NEGATIVE

## 2014-08-09 LAB — LIPASE, BLOOD: LIPASE: 18 U/L (ref 11–59)

## 2014-08-09 MED ORDER — IBUPROFEN 800 MG PO TABS: 800.0000 mg | ORAL_TABLET | Freq: Three times a day (TID) | ORAL | Status: DC

## 2014-08-09 MED ORDER — SULFAMETHOXAZOLE-TMP DS 800-160 MG PO TABS
1.0000 | ORAL_TABLET | Freq: Once | ORAL | Status: AC
  Administered 2014-08-09: 1 via ORAL
  Filled 2014-08-09: qty 1

## 2014-08-09 MED ORDER — SULFAMETHOXAZOLE-TRIMETHOPRIM 800-160 MG PO TABS: 1.0000 | ORAL_TABLET | Freq: Two times a day (BID) | ORAL | Status: DC

## 2014-08-09 MED ORDER — IBUPROFEN 800 MG PO TABS
800.0000 mg | ORAL_TABLET | Freq: Once | ORAL | Status: AC
  Administered 2014-08-09: 800 mg via ORAL
  Filled 2014-08-09: qty 1

## 2014-08-09 NOTE — ED Provider Notes (Signed)
CSN: [id number]     Arrival date & time 08/09/14  1339 History   First MD Initiated Contact with Patient 08/09/14 1548    This chart was scribed for Donnetta Hutching, MD by Marica Otter, ED Scribe. This patient was seen in room APA08/APA08 and the patient's care was started at 4:19 PM.  Chief Complaint  Patient presents with  . Abdominal Pain   The history is provided by the patient. No language interpreter was used.   PCP: No PCP Per Patient HPI Comments: Brandi Hurley is a 28 y.o. female, with medical Hx noted below and significant for asthma and ovarian cyst removal (2008), who presents to the Emergency Department complaining of a worsening abscess in her vaginal region onset 4 days ago.   Pt also complains of intermittent supraumbilical abd pain onset 4 days ago. Pt reports the pain only occurs at night and is relieved with ibuprofen. Pt denies n/v/d, decreased appetite, high BP, chronic health problems or any daily meds.   Past Medical History  Diagnosis Date  . Asthma   . Ovarian cyst     Right  . Obesity   . Amenorrhea 12/10/2013  . PCO (polycystic ovaries) 12/17/2013   Past Surgical History  Procedure Laterality Date  . Cyst excision     Family History  Problem Relation Age of Onset  . Hypertension Mother   . Asthma Sister   . Diabetes Maternal Aunt   . Diabetes Maternal Uncle    History  Substance Use Topics  . Smoking status: Never Smoker   . Smokeless tobacco: Never Used  . Alcohol Use: No   OB History   Grav Para Term Preterm Abortions TAB SAB Ect Mult Living   Review of Systems    Allergies  Review of patient's allergies indicates no known allergies.  Home Medications   Prior to Admission medications   Medication Sig Start Date End Date Taking? Authorizing Provider  albuterol (PROVENTIL HFA;VENTOLIN HFA) 108 (90 BASE) MCG/ACT inhaler Inhale 2 puffs into the lungs every 4 (four) hours as needed for wheezing or shortness of breath. 07/24/14   Yes Lyanne Co, MD  ibuprofen (ADVIL,MOTRIN) 800 MG tablet Take 1 tablet (800 mg total) by mouth 3 (three) times daily. 08/09/14   Donnetta Hutching, MD  sulfamethoxazole-trimethoprim (SEPTRA DS) 800-160 MG per tablet Take 1 tablet by mouth 2 (two) times daily. 08/09/14   Donnetta Hutching, MD   Triage Vitals: BP 129/92  Pulse 76  Temp(Src) 97.9 F (36.6 C) (Oral)  Resp 20  Ht  (1.753 m)  Wt 305 lb (138.347 kg)  BMI 45.02 kg/m2  SpO2 100%  LMP 06/28/2014 Physical Exam  Nursing note and vitals reviewed. Constitutional: She is oriented to person, place, and time. She appears well-developed and well-nourished. No distress.  HENT:  Head: Normocephalic and atraumatic.  Cardiovascular: Normal rate.   Pulmonary/Chest: Effort normal. No respiratory distress.  Abdominal: There is no tenderness.  Musculoskeletal: Normal range of motion.  Neurological: She is alert and oriented to person, place, and time.  Skin: Skin is warm and dry.  Left labia area of tenderness 1cm in diameter, not indurated.   Psychiatric: She has a normal mood and affect. Her behavior is normal.    ED Course  Procedures (including critical care time) DIAGNOSTIC STUDIES: Oxygen Saturation is 100% on RA, nl by my interpretation.    COORDINATION OF CARE: 4:24  PM-Discussed treatment plan which includes antibiotics and pain meds with pt at bedside and pt agreed to plan.   Labs Review Labs Reviewed  URINALYSIS, ROUTINE W REFLEX MICROSCOPIC - Abnormal; Notable for the following:    Specific Gravity, Urine <1.005 (*)    All other components within normal limits  CBC WITH DIFFERENTIAL - Abnormal; Notable for the following:    RBC 5.31 (*)    MCV 77.2 (*)    MCH 25.0 (*)    RDW 15.9 (*)    All other components within normal limits  COMPREHENSIVE METABOLIC PANEL - Abnormal; Notable for the following:    Sodium 136 (*)    Total Protein 9.0 (*)    Total Bilirubin 0.2 (*)    All other components within normal limits   PREGNANCY, URINE  LIPASE, BLOOD    Imaging Review No results found.   EKG Interpretation None      MDM   Final diagnoses:  Abscess    Patient has a minor abscess on her left external vaginal area.  It is not ready for an incision and drainage.  Mann conservative treatment with hot soaks and antibiotics.  Abdominal exam benign. Rx ibuprofen 800 mg for abdominal pain.  I personally performed the services described in this documentation, which was scribed in my presence. The recorded information has been reviewed and is accurate.    Donnetta Hutching, MD 08/09/14 [phone]

## 2014-08-09 NOTE — Discharge Instructions (Signed)
Abscess An abscess (boil or furuncle) is an infected area on or under the skin. This area is filled with yellowish-white fluid (pus) and other material (debris). HOME CARE   Only take medicines as told by your doctor.  If you were given antibiotic medicine, take it as directed. Finish the medicine even if you start to feel better.  If gauze is used, follow your doctor's directions for changing the gauze.  To avoid spreading the infection:  Keep your abscess covered with a bandage.  Wash your hands well.  Do not share personal care items, towels, or whirlpools with others.  Avoid skin contact with others.  Keep your skin and clothes clean around the abscess.  Keep all doctor visits as told. GET HELP RIGHT AWAY IF:   You have more pain, puffiness (swelling), or redness in the wound site.  You have more fluid or blood coming from the wound site.  You have muscle aches, chills, or you feel sick.  You have a fever. MAKE SURE YOU:   Understand these instructions.  Will watch your condition.  Will get help right away if you are not doing well or get worse. Document Released: 04/18/2008 Document Revised: 05/01/2012 Document Reviewed: 01/13/2012 [facility] Patient Information 2015 [place], [place]. This information is not intended to replace advice given to you by your health care provider. Make sure you discuss any questions you have with your health care provider.  For your boil, soak in hot soapy water. Antibiotic twice a day. At this time, he does not need to be opened up. It may get worse.  If that happens, you'll need to return to the emergency department where we can open the wound. Ibuprofen for abdominal pain.

## 2014-08-09 NOTE — ED Notes (Signed)
Pt abd pain for 3 days, denies N/V/D; abscess to vagina for 4 days; denies fever

## 2014-08-29 ENCOUNTER — Telehealth: Payer: Self-pay | Admitting: *Deleted

## 2014-08-29 ENCOUNTER — Encounter: Payer: Self-pay | Admitting: Obstetrics and Gynecology

## 2014-08-29 ENCOUNTER — Other Ambulatory Visit: Payer: Self-pay

## 2014-09-15 ENCOUNTER — Encounter (HOSPITAL_COMMUNITY): Payer: Self-pay | Admitting: Emergency Medicine

## 2014-09-22 NOTE — Telephone Encounter (Signed)
Unable to reach pt x 2, left message to return messages.

## 2014-10-29 DIAGNOSIS — H469 Unspecified optic neuritis: Secondary | ICD-10-CM | POA: Insufficient documentation

## 2015-01-14 DIAGNOSIS — G35 Multiple sclerosis: Secondary | ICD-10-CM | POA: Insufficient documentation

## 2015-07-30 IMAGING — CR DG SHOULDER 2+V*R*
3 series · 3 of 3 positions shown · non-contrast
Comparison: None.

CLINICAL DATA: Right shoulder pain for 1 week.

EXAM:
RIGHT SHOULDER - 2+ VIEW

[view not recorded (1 of 3)]
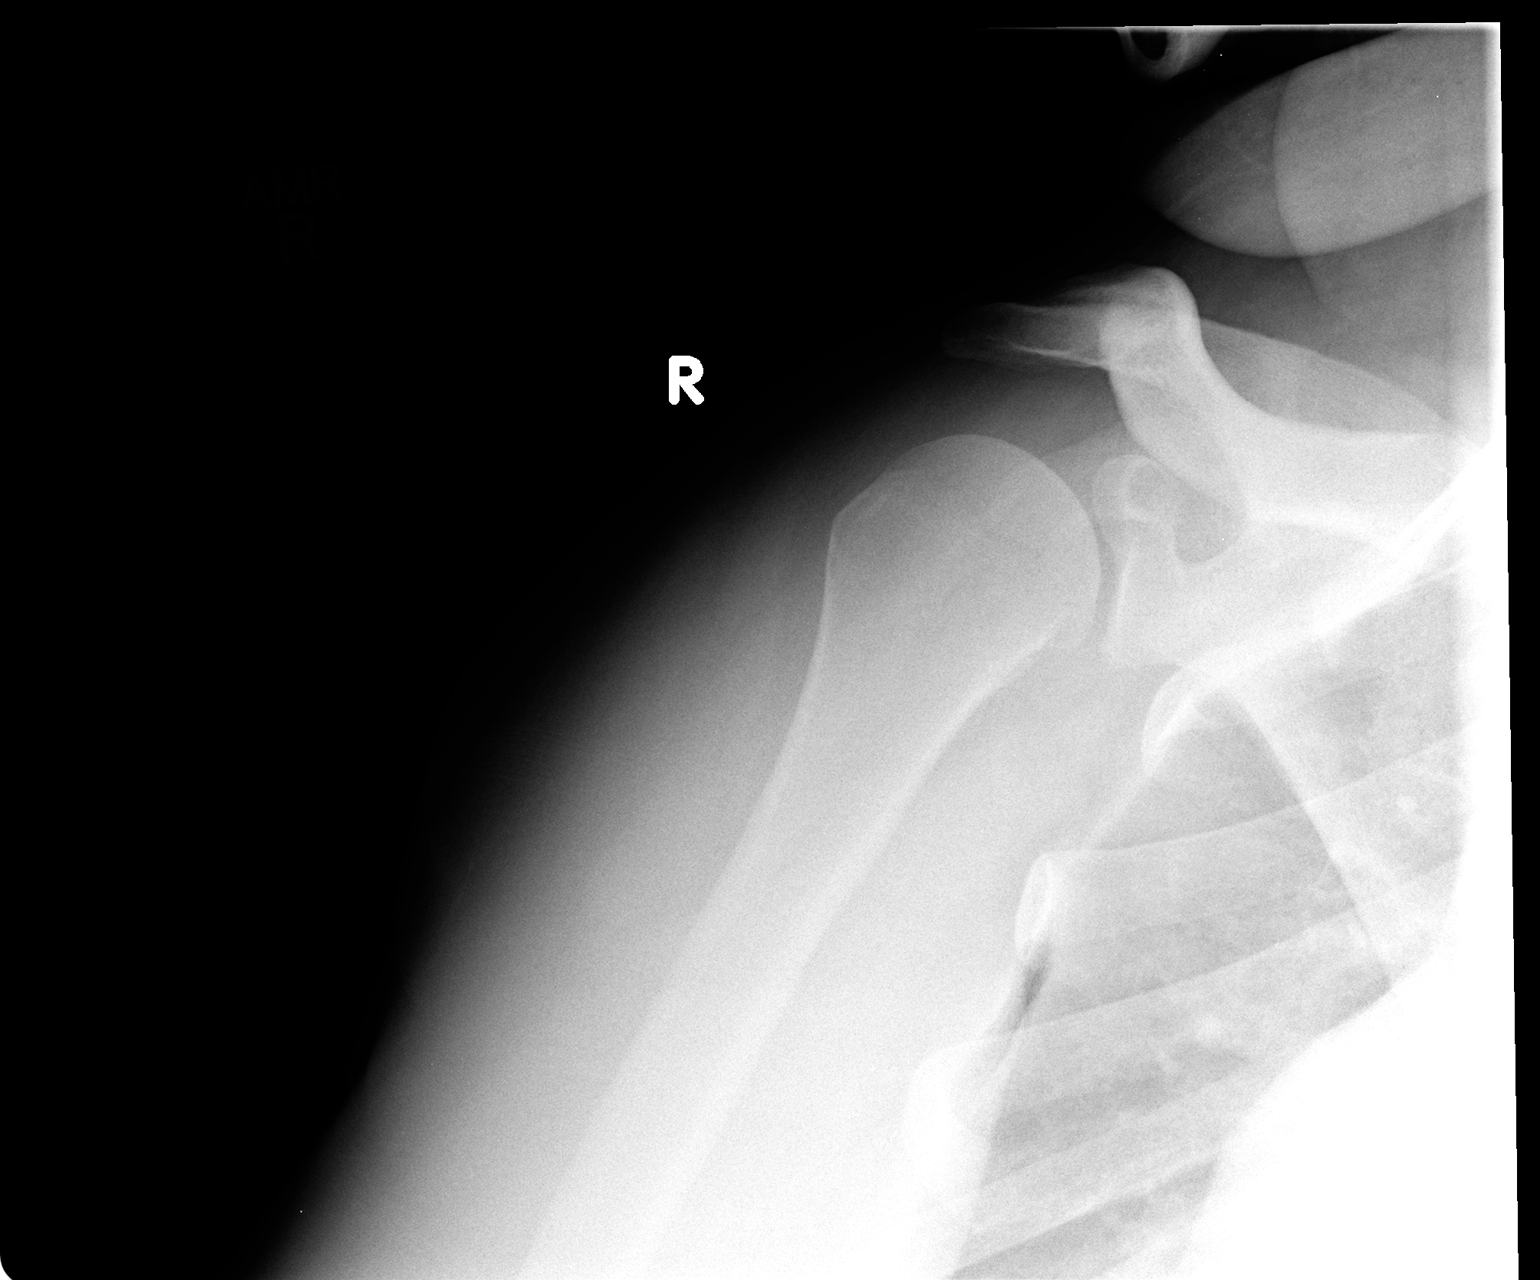

[view not recorded (2 of 3)]
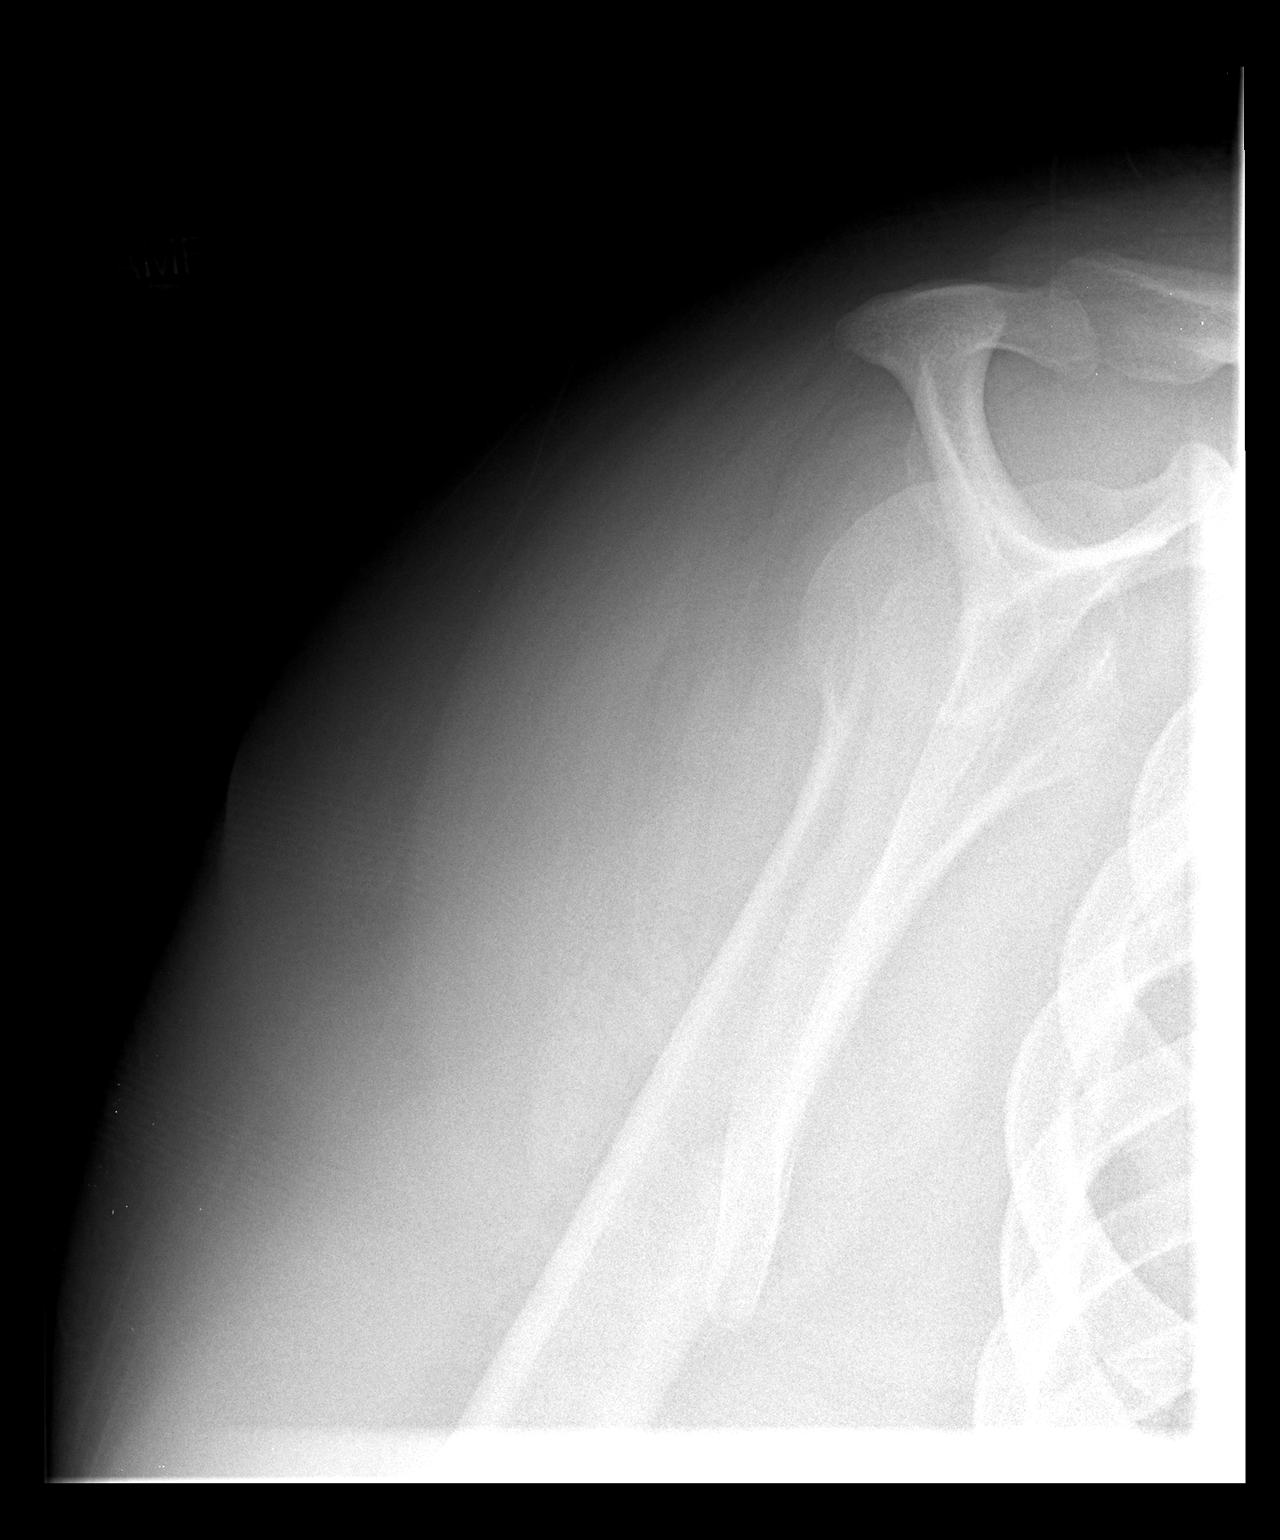

[view not recorded (3 of 3)]
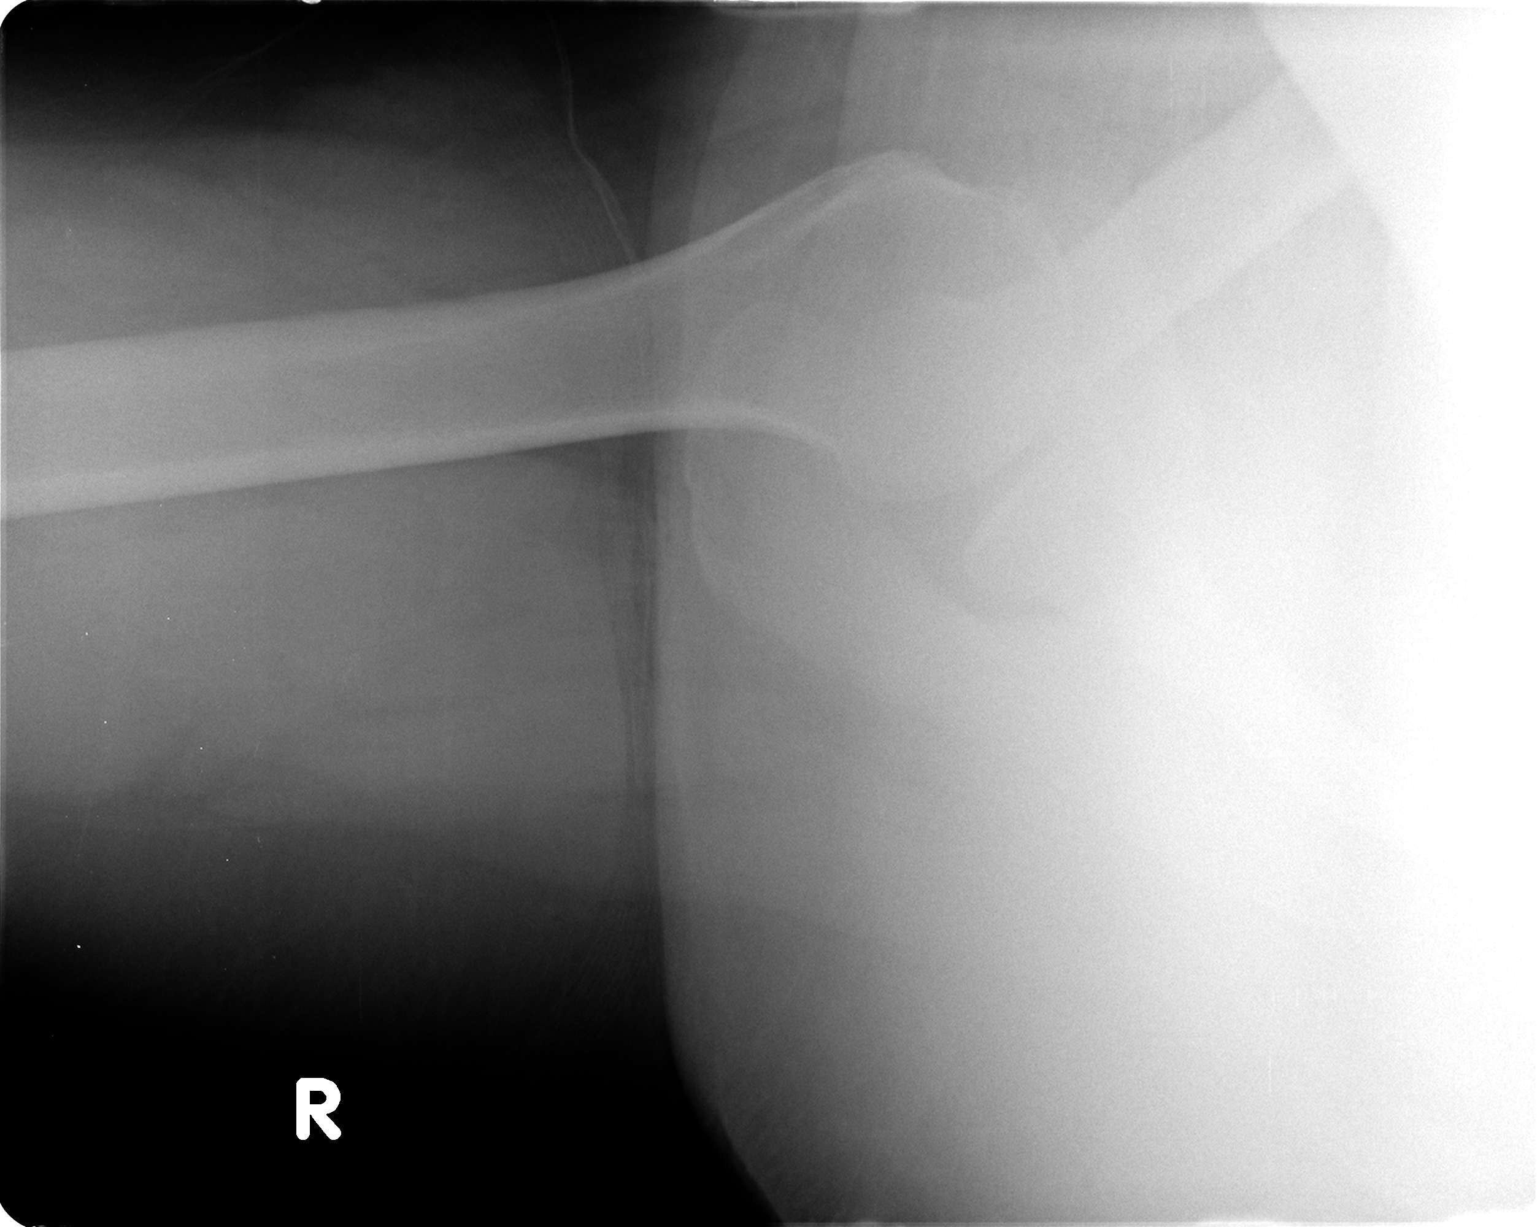

[3 of 3 positions shown; findings below may reference images not displayed]

FINDINGS: There is no evidence of fracture or dislocation. There is no
evidence of arthropathy or other focal bone abnormality. Soft
tissues are unremarkable.
IMPRESSION: Negative.

## 2016-12-22 DIAGNOSIS — R531 Weakness: Secondary | ICD-10-CM | POA: Insufficient documentation

## 2017-01-31 ENCOUNTER — Ambulatory Visit: Payer: Medicaid Other | Admitting: Neurology

## 2017-02-21 ENCOUNTER — Encounter: Payer: Self-pay | Admitting: Neurology

## 2017-02-21 ENCOUNTER — Ambulatory Visit (INDEPENDENT_AMBULATORY_CARE_PROVIDER_SITE_OTHER): Payer: Medicaid Other | Admitting: Neurology

## 2017-02-21 VITALS — BP 148/89 | HR 97 | Resp 20 | Ht 69.0 in | Wt 359.0 lb

## 2017-02-21 DIAGNOSIS — G35 Multiple sclerosis: Secondary | ICD-10-CM

## 2017-02-21 DIAGNOSIS — R7989 Other specified abnormal findings of blood chemistry: Secondary | ICD-10-CM | POA: Insufficient documentation

## 2017-02-21 DIAGNOSIS — R4586 Emotional lability: Secondary | ICD-10-CM

## 2017-02-21 DIAGNOSIS — H469 Unspecified optic neuritis: Secondary | ICD-10-CM | POA: Diagnosis not present

## 2017-02-21 DIAGNOSIS — F39 Unspecified mood [affective] disorder: Secondary | ICD-10-CM | POA: Diagnosis not present

## 2017-02-21 DIAGNOSIS — Z79899 Other long term (current) drug therapy: Secondary | ICD-10-CM | POA: Insufficient documentation

## 2017-02-21 DIAGNOSIS — G35D Multiple sclerosis, unspecified: Secondary | ICD-10-CM
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
## 2017-02-23 ENCOUNTER — Telehealth: Payer: Self-pay | Admitting: Neurology

## 2017-02-23 ENCOUNTER — Encounter: Payer: Self-pay | Admitting: *Deleted

## 2017-02-23 NOTE — Telephone Encounter (Signed)
I have spoken with Inetta Fermo at [facility] and confirmed that pt. has stopped Betaseron and is starting Tecfidera/fim

## 2017-02-23 NOTE — Telephone Encounter (Signed)
Brandi Hurley with [facility] is calling to discuss Dimethyl Fumarate 120 & 240 MG MISC and Dimethyl Fumarate 240 MG CPDR. She needs to clarify because the patient has an order for Beta Seron that was prescribed by Hali Marry, NP

## 2017-03-02 ENCOUNTER — Telehealth: Payer: Self-pay | Admitting: Neurology

## 2017-03-02 NOTE — Telephone Encounter (Signed)
LMOM (identified vm) to call to discuss other tx. options for MS/fim

## 2017-03-02 NOTE — Telephone Encounter (Signed)
Patient called office returning a call, but wasn't sure who it was too.

## 2017-03-02 NOTE — Telephone Encounter (Signed)
Ok    Since couldn't tolerate Tecfidera, she could try Aubagio, another pill, if she wants (would need to get Quantiferon TB test and updated hepatic function panel).   If she does not want to return to [facility], she could discuss further with Dr. Johnell Comings)

## 2017-03-02 NOTE — Telephone Encounter (Signed)
[facility] [phone] called said she just spoke to Roger/caregiver and was advised pt had severe reaction to first dose of tecfidera: tongue swelled, could not talk, difficulty breathing, head hurting,memory loss, pt felt like she was dying, throat swelled. She said Roger's mother is a Char[employer] fundraiser and came to eval, said the pt is doing much better but is still pointing to her head. Pt does not want to take tecfidera and would like to start back on Betaseron. Please call the patient

## 2017-03-02 NOTE — Telephone Encounter (Signed)
I have spoken with Fredrik Cove this morning (he is on [employer]).  He sts. pt. took first dose of Tecfidera this morning and had difficulty breathing with edema around mouth, felt as it throat was swollen, like she was dying.  I have advised Roger call 911 to have pt. taken to the ER, and to not take any more Tecfidera.  He verbalized understanding of same, but sts. his mother has said pt. is fine.  I have verified mult. times that Fredrik Cove is telling me pt. has had difficulty breathing, edema, felt as if she was dying, shortly after taking Tecfidera today.  I have instructed Roger mult. times that pt. should be eval in the ER, and not take any more Tecfidera.   I have offered appt. with [facility], but Roger sts. they are not able to secure transportation to [facility] and will be following up with Dr. Valda Favia

## 2017-03-02 NOTE — Telephone Encounter (Signed)
LMTC./fim 

## 2017-03-06 NOTE — Telephone Encounter (Signed)
I have spoken with Brandi Hurley this afternoon; to discuss labs needed prior to starting Aubagio.  She sts she prefers to remain on Tecfidera; sts. she has been taking Tecfidera bid as rx'd, and has not had further rxns to it. Sts. she will f/u with Dr. Johnell Comings in [facility], as she has difficulty securing transportation to [facility]

## 2018-03-12 ENCOUNTER — Telehealth: Payer: Self-pay | Admitting: Neurology

## 2018-03-12 NOTE — Telephone Encounter (Signed)
Spoke with Rosey Bath.  She requested r/f of Tecfidera. I have explained that Dr. Epimenio Foot can r/f this for her, but she would need to see him.  She verbalized understanding of same, but declined to make an appt. at this time.  She is aware Tecfidera can't be refilled unless she makes an appt/fim

## 2018-03-12 NOTE — Telephone Encounter (Signed)
Pt has returned the call asking to speak back with RN Faith before making an appointment

## 2018-03-12 NOTE — Telephone Encounter (Signed)
Pts boyfriend-Roger called stating that the pt never stopped taking Tecfidera and would like a refill, please call to advise

## 2018-03-12 NOTE — Telephone Encounter (Signed)
LMOM (identified vm) for pt. that she needs an appt. with RAS before he can r/f her Tecfidera.  She was seen here as a 2nd opinion only--her MS was followed by [facility] in [place].  That office is no longer open, and will not be reopening.  Dr. Epimenio Foot is happy to see her for her MS if she likes, but he has to see her before he can refill her Tecfidera.  If she would like to f/u with Dr. Epimenio Foot, please call for an appt/fim

## 2018-03-15 ENCOUNTER — Ambulatory Visit: Payer: Self-pay | Admitting: Neurology

## 2018-03-19 ENCOUNTER — Encounter: Payer: Self-pay | Admitting: Neurology
